# Patient Record
Sex: Female | Born: 1935 | Race: White | Hispanic: No | Marital: Married | Smoking: Never smoker
Health system: Southern US, Community
[De-identification: ages and names within clinical notes are randomized; demographics above are authoritative.]

## PROBLEM LIST (undated history)

## (undated) DIAGNOSIS — I10 Essential (primary) hypertension: Secondary | ICD-10-CM

---

## 1999-03-18 ENCOUNTER — Encounter: Payer: Self-pay | Admitting: Obstetrics and Gynecology

## 1999-03-18 ENCOUNTER — Encounter: Admission: RE | Admit: 1999-03-18 | Discharge: 1999-03-18 | Payer: Self-pay | Admitting: Obstetrics and Gynecology

## 2000-03-27 ENCOUNTER — Encounter: Payer: Self-pay | Admitting: Obstetrics and Gynecology

## 2000-03-27 ENCOUNTER — Encounter: Admission: RE | Admit: 2000-03-27 | Discharge: 2000-03-27 | Payer: Self-pay | Admitting: Obstetrics and Gynecology

## 2001-06-26 ENCOUNTER — Encounter: Admission: RE | Admit: 2001-06-26 | Discharge: 2001-06-26 | Payer: Self-pay | Admitting: Obstetrics and Gynecology

## 2001-06-26 ENCOUNTER — Encounter: Payer: Self-pay | Admitting: Obstetrics and Gynecology

## 2020-05-15 ENCOUNTER — Emergency Department (HOSPITAL_COMMUNITY)
Admission: EM | Admit: 2020-05-15 | Discharge: 2020-05-15 | Disposition: A | Discharge status: Home / Self Care | Payer: Medicare Other | Attending: Emergency Medicine | Admitting: Emergency Medicine

## 2020-05-15 ENCOUNTER — Emergency Department (HOSPITAL_COMMUNITY): Payer: Medicare Other

## 2020-05-15 ENCOUNTER — Other Ambulatory Visit: Payer: Self-pay

## 2020-05-15 ENCOUNTER — Encounter (HOSPITAL_COMMUNITY): Payer: Self-pay | Admitting: Emergency Medicine

## 2020-05-15 DIAGNOSIS — R112 Nausea with vomiting, unspecified: Secondary | ICD-10-CM | POA: Insufficient documentation

## 2020-05-15 DIAGNOSIS — Z7982 Long term (current) use of aspirin: Secondary | ICD-10-CM | POA: Insufficient documentation

## 2020-05-15 DIAGNOSIS — S0511XA Contusion of eyeball and orbital tissues, right eye, initial encounter: Secondary | ICD-10-CM | POA: Diagnosis not present

## 2020-05-15 DIAGNOSIS — W228XXA Striking against or struck by other objects, initial encounter: Secondary | ICD-10-CM | POA: Insufficient documentation

## 2020-05-15 DIAGNOSIS — R55 Syncope and collapse: Secondary | ICD-10-CM

## 2020-05-15 DIAGNOSIS — S0501XA Injury of conjunctiva and corneal abrasion without foreign body, right eye, initial encounter: Secondary | ICD-10-CM | POA: Diagnosis present

## 2020-05-15 LAB — CBC WITH DIFFERENTIAL/PLATELET
Abs Immature Granulocytes: 0.06 10*3/uL (ref 0.00–0.07)
Basophils Absolute: 0 10*3/uL (ref 0.0–0.1)
Basophils Relative: 0 %
Eosinophils Absolute: 0 10*3/uL (ref 0.0–0.5)
Eosinophils Relative: 0 %
HCT: 35.4 % — ABNORMAL LOW (ref 36.0–46.0)
Hemoglobin: 11.9 g/dL — ABNORMAL LOW (ref 12.0–15.0)
Immature Granulocytes: 1 %
Lymphocytes Relative: 7 %
Lymphs Abs: 0.6 10*3/uL — ABNORMAL LOW (ref 0.7–4.0)
MCH: 30.5 pg (ref 26.0–34.0)
MCHC: 33.6 g/dL (ref 30.0–36.0)
MCV: 90.8 fL (ref 80.0–100.0)
Monocytes Absolute: 0.2 10*3/uL (ref 0.1–1.0)
Monocytes Relative: 2 %
Neutro Abs: 7.7 10*3/uL (ref 1.7–7.7)
Neutrophils Relative %: 90 %
Platelets: 257 10*3/uL (ref 150–400)
RBC: 3.9 MIL/uL (ref 3.87–5.11)
RDW: 11.8 % (ref 11.5–15.5)
WBC: 8.6 10*3/uL (ref 4.0–10.5)
nRBC: 0 % (ref 0.0–0.2)

## 2020-05-15 LAB — TROPONIN I (HIGH SENSITIVITY): Troponin I (High Sensitivity): 6 ng/L (ref <18)

## 2020-05-15 LAB — COMPREHENSIVE METABOLIC PANEL
ALT: 18 U/L (ref 0–44)
AST: 22 U/L (ref 15–41)
Albumin: 3.7 g/dL (ref 3.5–5.0)
Alkaline Phosphatase: 66 U/L (ref 38–126)
Anion gap: 12 (ref 5–15)
BUN: 11 mg/dL (ref 8–23)
CO2: 23 mmol/L (ref 22–32)
Calcium: 9.2 mg/dL (ref 8.9–10.3)
Chloride: 91 mmol/L — ABNORMAL LOW (ref 98–111)
Creatinine, Ser: 0.61 mg/dL (ref 0.44–1.00)
GFR, Estimated: >60 mL/min (ref >60)
Glucose, Bld: 135 mg/dL — ABNORMAL HIGH (ref 70–99)
Potassium: 3.5 mmol/L (ref 3.5–5.1)
Sodium: 126 mmol/L — ABNORMAL LOW (ref 135–145)
Total Bilirubin: 0.6 mg/dL (ref 0.3–1.2)
Total Protein: 7.1 g/dL (ref 6.5–8.1)

## 2020-05-15 LAB — URINALYSIS, ROUTINE W REFLEX MICROSCOPIC
Bilirubin Urine: NEGATIVE
Glucose, UA: NEGATIVE mg/dL
Hgb urine dipstick: NEGATIVE
Ketones, ur: NEGATIVE mg/dL
Leukocytes,Ua: NEGATIVE
Nitrite: NEGATIVE
Protein, ur: NEGATIVE mg/dL
Specific Gravity, Urine: 1.018 (ref 1.005–1.030)
pH: 6 (ref 5.0–8.0)

## 2020-05-15 LAB — LIPASE, BLOOD: Lipase: 41 U/L (ref 11–51)

## 2020-05-15 MED ORDER — ONDANSETRON 4 MG PO TBDP
4.0000 mg | ORAL_TABLET | Freq: Once | ORAL | Status: AC
  Administered 2020-05-15: 4 mg via ORAL
  Filled 2020-05-15: qty 1

## 2020-05-15 MED ORDER — ONDANSETRON 4 MG PO TBDP: 4.0000 mg | ORAL_TABLET | Freq: Three times a day (TID) | ORAL | 0 refills | Status: AC | PRN

## 2020-05-15 NOTE — ED Notes (Signed)
Oneita Hurt, [place], made aware that pt would be returning with new prescriptions. Granddaughter will also be bringing pt back to the facility.

## 2020-05-15 NOTE — ED Provider Notes (Signed)
[facility] EMERGENCY DEPARTMENT Provider Note   CSN: [id number] Arrival date & time: 05/15/20  1713     History Chief Complaint  Patient presents with  . Loss of Consciousness    Brittney Baker is a 87 y.o. female with h/o HLD who presents to ED via EMS from SNF for N/V and syncope. Patient awoke in her usual state of health earlier today. Several hours after eating lunch, patient developed nausea and vomiting. NBNB emesis noted. No diarrhea or abdominal pain. Immediately after vomiting, patient briefly passed out and struck her head against the commode. Husband witnessed her fall. She states she awoke almost immediately after the fall. Denies prodromal headache, vision changes, palpitations, chest pain, or SOB. No vomiting since that time. In ED, denies HA, vision changes, abdominal pain, urinary symptoms, numbness, weakness, or trouble walking.  The history is provided by the patient and medical records.  Loss of Consciousness Episode history:  Single Most recent episode:  Today Duration: several seconds. Timing:  Rare Progression:  Resolved Chronicity:  New Context comment:  Vomiting Witnessed: yes   Relieved by:  Nothing Worsened by:  Nothing Ineffective treatments:  None tried Associated symptoms: nausea and vomiting   Associated symptoms: no chest pain, no diaphoresis, no difficulty breathing, no dizziness, no fever, no focal sensory loss, no focal weakness, no headaches, no palpitations, no recent injury, no seizures, no shortness of breath, no visual change and no weakness   Risk factors: no coronary artery disease and no seizures        History reviewed. No pertinent past medical history.  There are no problems to display for this patient.   History reviewed. No pertinent surgical history.   OB History   No obstetric history on file.     History reviewed. No pertinent family history.     Home Medications Prior to Admission medications    Medication Sig Start Date End Date Taking? Authorizing Provider  aspirin EC 81 MG tablet Take 81 mg by mouth every morning. Swallow whole.   Yes [provider]  Brinzolamide-Brimonidine (SIMBRINZA) 1-0.2 % SUSP Place 1 drop into both eyes at bedtime.   Yes [provider]  lisinopril-hydrochlorothiazide (ZESTORETIC) 20-12.5 MG tablet Take 1 tablet by mouth every morning.   Yes [provider]  ondansetron (ZOFRAN ODT) 4 MG disintegrating tablet Take 1 tablet (4 mg total) by mouth every 8 (eight) hours as needed for up to 3 days for nausea or vomiting. 05/15/20 05/18/20 Yes Tonia Brooms, MD  simvastatin (ZOCOR) 20 MG tablet Take 20 mg by mouth at bedtime.   Yes [provider]  Vitamin D, Ergocalciferol, (DRISDOL) 1.25 MG (50000 UNIT) CAPS capsule Take 50,000 Units by mouth every Thursday.   Yes [provider]    Allergies    Clindamycin/lincomycin  Review of Systems   Review of Systems  Constitutional: Negative for chills, diaphoresis and fever.  HENT: Negative for ear pain and sore throat.   Eyes: Negative for pain and visual disturbance.  Respiratory: Negative for cough and shortness of breath.   Cardiovascular: Positive for syncope. Negative for chest pain and palpitations.  Gastrointestinal: Positive for nausea and vomiting. Negative for abdominal pain, constipation and diarrhea.  Genitourinary: Negative for dysuria and hematuria.  Musculoskeletal: Negative for arthralgias and back pain.  Skin: Negative for color change and rash.  Neurological: Positive for syncope. Negative for dizziness, focal weakness, seizures, weakness and headaches.  All other systems reviewed and are negative.  Physical Exam Updated Vital Signs BP 121/70 (BP Location: Right Arm)   Pulse 85   Temp 98.7 F (37.1 C) (Oral)   Resp 20   Ht 4\' 11"  (1.499 m)   Wt 47.6 kg   SpO2 98%   BMI 21.21 kg/m   Physical Exam Vitals and nursing note reviewed.   Constitutional:      General: She is awake. She is not in acute distress.    Appearance: Normal appearance. She is well-developed, well-groomed and normal weight. She is not ill-appearing.  HENT:     Head: Normocephalic. No laceration.     Jaw: There is normal jaw occlusion. No trismus, tenderness, swelling or malocclusion.     Comments: Bruising under R eye without underlying deformity or tenderness    Right Ear: External ear normal.     Left Ear: External ear normal.     Nose: Nose normal.     Mouth/Throat:     Mouth: Mucous membranes are moist.     Pharynx: Oropharynx is clear. No oropharyngeal exudate or posterior oropharyngeal erythema.  Eyes:     General: No visual field deficit or scleral icterus.       Right eye: No discharge.        Left eye: No discharge.     Extraocular Movements: Extraocular movements intact.     Conjunctiva/sclera: Conjunctivae normal.     Pupils: Pupils are equal, round, and reactive to light.  Cardiovascular:     Rate and Rhythm: Normal rate and regular rhythm.     Pulses: Normal pulses.     Heart sounds: Normal heart sounds.  Pulmonary:     Effort: Pulmonary effort is normal. No respiratory distress.     Breath sounds: Normal breath sounds.  Abdominal:     General: Abdomen is flat. There is no distension.     Palpations: Abdomen is soft.     Tenderness: There is no abdominal tenderness. There is no guarding or rebound.  Musculoskeletal:        General: No signs of injury.     Cervical back: No signs of trauma. No pain with movement, spinous process tenderness or muscular tenderness.     Right lower leg: No edema.     Left lower leg: No edema.  Skin:    General: Skin is warm and dry.     Findings: No rash.  Neurological:     General: No focal deficit present.     Mental Status: She is alert and oriented to person, place, and time.     GCS: GCS eye subscore is 4. GCS verbal subscore is 5. GCS motor subscore is 6.     Cranial Nerves: No  dysarthria or facial asymmetry.     Sensory: Sensation is intact.     Motor: Motor function is intact. No weakness.  Psychiatric:        Mood and Affect: Mood normal.        Behavior: Behavior normal. Behavior is cooperative.     ED Results / Procedures / Treatments   Labs (all labs ordered are listed, but only abnormal results are displayed) Labs Reviewed  CBC WITH DIFFERENTIAL/PLATELET - Abnormal; Notable for the following components:      Result Value   Hemoglobin 11.9 (*)    HCT 35.4 (*)    Lymphs Abs 0.6 (*)    All other components within normal limits  COMPREHENSIVE METABOLIC PANEL - Abnormal; Notable for the following components:   Sodium  126 (*)    Chloride 91 (*)    Glucose, Bld 135 (*)    All other components within normal limits  LIPASE, BLOOD  URINALYSIS, ROUTINE W REFLEX MICROSCOPIC  TROPONIN I (HIGH SENSITIVITY)    EKG EKG Interpretation  Date/Time:  Saturday May 15 2020 17:18:56 EST Ventricular Rate:  96 PR Interval:    QRS Duration: 86 QT Interval:  379 QTC Calculation: 479 R Axis:   86 Text Interpretation: Sinus rhythm Borderline prolonged PR interval Biatrial enlargement Borderline right axis deviation No old tracing to compare Confirmed by Jacalyn LefevreHaviland, Julie [phone]([id number]) on 05/15/2020 5:22:11 PM   Radiology CT Head Wo Contrast  Result Date: 05/15/2020 CLINICAL DATA:  Head trauma.  Syncope. EXAM: CT HEAD WITHOUT CONTRAST TECHNIQUE: Contiguous axial images were obtained from the base of the skull through the vertex without intravenous contrast. COMPARISON:  None. FINDINGS: Brain: No evidence of acute infarction, hemorrhage, hydrocephalus, extra-axial collection or mass lesion/mass effect. Moderate brain parenchymal volume loss and deep white matter microangiopathy. Vascular: No hyperdense vessel or unexpected calcification. Skull: Normal. Negative for fracture or focal lesion. Sinuses/Orbits: No acute finding. Other: None. IMPRESSION: 1. No acute intracranial  abnormality. 2. Moderate brain parenchymal atrophy and chronic microvascular disease. Electronically Signed   By: Ted Mcalpineobrinka  Dimitrova M.D.   On: 05/15/2020 17:53   DG Chest Portable 1 View  Result Date: 05/15/2020 CLINICAL DATA:  Syncopal episodes. EXAM: PORTABLE CHEST 1 VIEW COMPARISON:  None. FINDINGS: Cardiomediastinal silhouette is normal. Mediastinal contours appear intact. Calcific atherosclerotic disease of the aorta. There is no evidence of focal airspace consolidation, pleural effusion or pneumothorax. Osseous structures are without acute abnormality. Soft tissues are grossly normal. IMPRESSION: No active disease. Electronically Signed   By: Ted Mcalpineobrinka  Dimitrova M.D.   On: 05/15/2020 17:47    Procedures Procedures  Medications Ordered in ED Medications  ondansetron (ZOFRAN-ODT) disintegrating tablet 4 mg (4 mg Oral Given 05/15/20 1824)    ED Course  I have reviewed the triage vital signs and the nursing notes.  Pertinent labs & imaging results that were available during my care of the patient were reviewed by me and considered in my medical decision making (see chart for details).    MDM Rules/Calculators/A&P                          Patient is a very well-appearing 86yoF with history and physical as described above who presents to the ED for N/V and syncope. VS reassuring and HDS. Patient resting comfortably and in no acute distress. No focal neuro deficits. Will obtain head CT, CXR, blood work, and ECG. Initial treatment includes Zofran.  ECG not concerning for ischemia or dysrhythmia. Head CT and CXR unremarkable. Labs notable for Na 126 (no old labs available for comparison) and Cl 91. Remainder of labs reassuring. On reassessment, patient remains well appearing and comfortable. No episodes of emesis during ED course and tolerating PO. Suspect syncopal event likely vasovagal in setting of emesis. Given overall reassuring workup, doubt CVA/TIA, ACS, PE, dysrhythmia, sepsis,  hypoglycemia, seizure, SBO, UTI, pyelonephritis, PTX, cardiac tamponade, or ICH. Sodium noted to be low, however given patient awake, alert, and otherwise feeling herself, doubt this would be contributing to her current clinical presentation. Recommend she follow up with PCP this upcoming weak to have sodium rechecked. She is otherwise HDS and appropriate for discharge.  Strict return precautions provided and discussed. Questions and concerns addressed. Will prescribe Zofran at home for nausea. Patient  verbalized understanding and amenable with discharge plan. Discharged in stable condition.  Final Clinical Impression(s) / ED Diagnoses Final diagnoses:  Vasovagal syncope  Non-intractable vomiting with nausea, unspecified vomiting type    Rx / DC Orders ED Discharge Orders         Ordered    ondansetron (ZOFRAN ODT) 4 MG disintegrating tablet  Every 8 hours PRN        05/15/20 2026           Tonia Brooms, MD 05/16/20 0001    Jacalyn Lefevre, MD 05/16/20 1553

## 2020-05-15 NOTE — ED Triage Notes (Signed)
Pt BIB [facility] from [facility] after suffering two syncopal episodes. One of which did result in a fall where the pt hit her head. Notable swelling and bruising to R eye noted. Pt has been having episodes of nausea/vomiting/diarrhea that started after lunch around 11 a.m. today. Pt VSS per EMS. EMS observed a pre-syncopal episode when obtaining orthostatic VS. Pt positive for orthostatics. Other VSS. Pt AOx4 and not reporting any pain at this time.

## 2020-05-15 NOTE — ED Notes (Signed)
Patient verbalizes understanding of discharge instructions. Prescriptions and follow-up care reviewed. Facility made aware that pt will be returning. Opportunity for questioning and answers were provided. Armband removed by staff, pt discharged from ED ambulatory.

## 2020-05-16 ENCOUNTER — Encounter (HOSPITAL_COMMUNITY): Payer: Self-pay | Admitting: Emergency Medicine

## 2020-09-15 ENCOUNTER — Ambulatory Visit: Payer: Self-pay | Admitting: Family Medicine

## 2020-11-23 ENCOUNTER — Ambulatory Visit: Payer: Medicare Other | Admitting: Emergency Medicine

## 2021-01-27 ENCOUNTER — Ambulatory Visit: Payer: Medicare Other | Admitting: Internal Medicine

## 2021-04-08 DIAGNOSIS — H401132 Primary open-angle glaucoma, bilateral, moderate stage: Secondary | ICD-10-CM | POA: Diagnosis not present

## 2021-04-08 DIAGNOSIS — H2513 Age-related nuclear cataract, bilateral: Secondary | ICD-10-CM | POA: Diagnosis not present

## 2021-08-05 DIAGNOSIS — H401132 Primary open-angle glaucoma, bilateral, moderate stage: Secondary | ICD-10-CM | POA: Diagnosis not present

## 2021-08-05 DIAGNOSIS — H2513 Age-related nuclear cataract, bilateral: Secondary | ICD-10-CM | POA: Diagnosis not present

## 2021-08-10 DIAGNOSIS — E871 Hypo-osmolality and hyponatremia: Secondary | ICD-10-CM | POA: Diagnosis not present

## 2021-08-10 DIAGNOSIS — I2510 Atherosclerotic heart disease of native coronary artery without angina pectoris: Secondary | ICD-10-CM | POA: Diagnosis not present

## 2021-08-10 DIAGNOSIS — E559 Vitamin D deficiency, unspecified: Secondary | ICD-10-CM | POA: Diagnosis not present

## 2021-08-10 DIAGNOSIS — I10 Essential (primary) hypertension: Secondary | ICD-10-CM | POA: Diagnosis not present

## 2021-08-10 DIAGNOSIS — E7800 Pure hypercholesterolemia, unspecified: Secondary | ICD-10-CM | POA: Diagnosis not present

## 2021-12-06 DIAGNOSIS — H2513 Age-related nuclear cataract, bilateral: Secondary | ICD-10-CM | POA: Diagnosis not present

## 2021-12-06 DIAGNOSIS — H401132 Primary open-angle glaucoma, bilateral, moderate stage: Secondary | ICD-10-CM | POA: Diagnosis not present

## 2021-12-23 DIAGNOSIS — M545 Low back pain, unspecified: Secondary | ICD-10-CM | POA: Diagnosis not present

## 2021-12-27 DIAGNOSIS — R918 Other nonspecific abnormal finding of lung field: Secondary | ICD-10-CM | POA: Diagnosis not present

## 2021-12-27 DIAGNOSIS — S22089A Unspecified fracture of T11-T12 vertebra, initial encounter for closed fracture: Secondary | ICD-10-CM | POA: Diagnosis not present

## 2021-12-27 DIAGNOSIS — J90 Pleural effusion, not elsewhere classified: Secondary | ICD-10-CM | POA: Diagnosis not present

## 2021-12-27 DIAGNOSIS — M549 Dorsalgia, unspecified: Secondary | ICD-10-CM | POA: Diagnosis not present

## 2021-12-27 DIAGNOSIS — R52 Pain, unspecified: Secondary | ICD-10-CM | POA: Diagnosis not present

## 2021-12-27 DIAGNOSIS — M4319 Spondylolisthesis, multiple sites in spine: Secondary | ICD-10-CM | POA: Diagnosis not present

## 2021-12-27 DIAGNOSIS — I10 Essential (primary) hypertension: Secondary | ICD-10-CM | POA: Diagnosis not present

## 2021-12-27 DIAGNOSIS — J80 Acute respiratory distress syndrome: Secondary | ICD-10-CM | POA: Diagnosis not present

## 2021-12-27 DIAGNOSIS — E871 Hypo-osmolality and hyponatremia: Secondary | ICD-10-CM | POA: Diagnosis not present

## 2021-12-27 DIAGNOSIS — Z743 Need for continuous supervision: Secondary | ICD-10-CM | POA: Diagnosis not present

## 2021-12-28 DIAGNOSIS — M545 Low back pain, unspecified: Secondary | ICD-10-CM | POA: Diagnosis not present

## 2021-12-28 DIAGNOSIS — S22080A Wedge compression fracture of T11-T12 vertebra, initial encounter for closed fracture: Secondary | ICD-10-CM | POA: Diagnosis not present

## 2021-12-28 DIAGNOSIS — Z1152 Encounter for screening for COVID-19: Secondary | ICD-10-CM | POA: Diagnosis not present

## 2021-12-28 DIAGNOSIS — R0602 Shortness of breath: Secondary | ICD-10-CM | POA: Diagnosis not present

## 2021-12-28 DIAGNOSIS — E785 Hyperlipidemia, unspecified: Secondary | ICD-10-CM | POA: Diagnosis not present

## 2021-12-28 DIAGNOSIS — Z79899 Other long term (current) drug therapy: Secondary | ICD-10-CM | POA: Diagnosis not present

## 2021-12-28 DIAGNOSIS — M4319 Spondylolisthesis, multiple sites in spine: Secondary | ICD-10-CM | POA: Diagnosis not present

## 2021-12-28 DIAGNOSIS — R0600 Dyspnea, unspecified: Secondary | ICD-10-CM | POA: Diagnosis not present

## 2021-12-28 DIAGNOSIS — E876 Hypokalemia: Secondary | ICD-10-CM | POA: Diagnosis not present

## 2021-12-28 DIAGNOSIS — R918 Other nonspecific abnormal finding of lung field: Secondary | ICD-10-CM | POA: Diagnosis not present

## 2021-12-28 DIAGNOSIS — W1830XA Fall on same level, unspecified, initial encounter: Secondary | ICD-10-CM | POA: Diagnosis not present

## 2021-12-28 DIAGNOSIS — K5900 Constipation, unspecified: Secondary | ICD-10-CM | POA: Diagnosis not present

## 2021-12-28 DIAGNOSIS — S3992XA Unspecified injury of lower back, initial encounter: Secondary | ICD-10-CM | POA: Diagnosis not present

## 2021-12-28 DIAGNOSIS — E222 Syndrome of inappropriate secretion of antidiuretic hormone: Secondary | ICD-10-CM | POA: Diagnosis not present

## 2021-12-28 DIAGNOSIS — S22089A Unspecified fracture of T11-T12 vertebra, initial encounter for closed fracture: Secondary | ICD-10-CM | POA: Diagnosis not present

## 2021-12-28 DIAGNOSIS — R110 Nausea: Secondary | ICD-10-CM | POA: Diagnosis not present

## 2021-12-28 DIAGNOSIS — W19XXXA Unspecified fall, initial encounter: Secondary | ICD-10-CM | POA: Diagnosis not present

## 2021-12-28 DIAGNOSIS — S22081A Stable burst fracture of T11-T12 vertebra, initial encounter for closed fracture: Secondary | ICD-10-CM | POA: Diagnosis not present

## 2021-12-28 DIAGNOSIS — R062 Wheezing: Secondary | ICD-10-CM | POA: Diagnosis not present

## 2021-12-28 DIAGNOSIS — Z7982 Long term (current) use of aspirin: Secondary | ICD-10-CM | POA: Diagnosis not present

## 2021-12-28 DIAGNOSIS — E871 Hypo-osmolality and hyponatremia: Secondary | ICD-10-CM | POA: Diagnosis not present

## 2021-12-28 DIAGNOSIS — R0902 Hypoxemia: Secondary | ICD-10-CM | POA: Diagnosis not present

## 2021-12-28 DIAGNOSIS — M4854XA Collapsed vertebra, not elsewhere classified, thoracic region, initial encounter for fracture: Secondary | ICD-10-CM | POA: Diagnosis not present

## 2021-12-28 DIAGNOSIS — S22082A Unstable burst fracture of T11-T12 vertebra, initial encounter for closed fracture: Secondary | ICD-10-CM | POA: Diagnosis not present

## 2021-12-28 DIAGNOSIS — J90 Pleural effusion, not elsewhere classified: Secondary | ICD-10-CM | POA: Diagnosis not present

## 2021-12-28 DIAGNOSIS — D72829 Elevated white blood cell count, unspecified: Secondary | ICD-10-CM | POA: Diagnosis not present

## 2021-12-28 DIAGNOSIS — I10 Essential (primary) hypertension: Secondary | ICD-10-CM | POA: Diagnosis not present

## 2021-12-29 DIAGNOSIS — R0602 Shortness of breath: Secondary | ICD-10-CM | POA: Diagnosis not present

## 2021-12-29 DIAGNOSIS — S3992XA Unspecified injury of lower back, initial encounter: Secondary | ICD-10-CM | POA: Diagnosis not present

## 2021-12-29 DIAGNOSIS — R110 Nausea: Secondary | ICD-10-CM | POA: Diagnosis not present

## 2021-12-29 DIAGNOSIS — R0600 Dyspnea, unspecified: Secondary | ICD-10-CM | POA: Diagnosis not present

## 2021-12-29 DIAGNOSIS — W19XXXA Unspecified fall, initial encounter: Secondary | ICD-10-CM | POA: Diagnosis not present

## 2021-12-29 DIAGNOSIS — K5900 Constipation, unspecified: Secondary | ICD-10-CM | POA: Diagnosis not present

## 2021-12-29 DIAGNOSIS — I10 Essential (primary) hypertension: Secondary | ICD-10-CM | POA: Diagnosis not present

## 2021-12-29 DIAGNOSIS — S22089A Unspecified fracture of T11-T12 vertebra, initial encounter for closed fracture: Secondary | ICD-10-CM | POA: Diagnosis not present

## 2021-12-29 DIAGNOSIS — E871 Hypo-osmolality and hyponatremia: Secondary | ICD-10-CM | POA: Diagnosis not present

## 2021-12-29 DIAGNOSIS — S22081A Stable burst fracture of T11-T12 vertebra, initial encounter for closed fracture: Secondary | ICD-10-CM | POA: Diagnosis not present

## 2021-12-30 DIAGNOSIS — R110 Nausea: Secondary | ICD-10-CM | POA: Diagnosis not present

## 2021-12-30 DIAGNOSIS — K5900 Constipation, unspecified: Secondary | ICD-10-CM | POA: Diagnosis not present

## 2021-12-30 DIAGNOSIS — I10 Essential (primary) hypertension: Secondary | ICD-10-CM | POA: Diagnosis not present

## 2021-12-30 DIAGNOSIS — R0600 Dyspnea, unspecified: Secondary | ICD-10-CM | POA: Diagnosis not present

## 2021-12-30 DIAGNOSIS — J90 Pleural effusion, not elsewhere classified: Secondary | ICD-10-CM | POA: Diagnosis not present

## 2021-12-30 DIAGNOSIS — E871 Hypo-osmolality and hyponatremia: Secondary | ICD-10-CM | POA: Diagnosis not present

## 2021-12-30 DIAGNOSIS — M4854XA Collapsed vertebra, not elsewhere classified, thoracic region, initial encounter for fracture: Secondary | ICD-10-CM | POA: Diagnosis not present

## 2021-12-30 DIAGNOSIS — S22082A Unstable burst fracture of T11-T12 vertebra, initial encounter for closed fracture: Secondary | ICD-10-CM | POA: Diagnosis not present

## 2021-12-30 DIAGNOSIS — W19XXXA Unspecified fall, initial encounter: Secondary | ICD-10-CM | POA: Diagnosis not present

## 2021-12-30 DIAGNOSIS — S22081A Stable burst fracture of T11-T12 vertebra, initial encounter for closed fracture: Secondary | ICD-10-CM | POA: Diagnosis not present

## 2021-12-31 DIAGNOSIS — K5900 Constipation, unspecified: Secondary | ICD-10-CM | POA: Diagnosis not present

## 2021-12-31 DIAGNOSIS — I10 Essential (primary) hypertension: Secondary | ICD-10-CM | POA: Diagnosis not present

## 2021-12-31 DIAGNOSIS — R110 Nausea: Secondary | ICD-10-CM | POA: Diagnosis not present

## 2021-12-31 DIAGNOSIS — W19XXXA Unspecified fall, initial encounter: Secondary | ICD-10-CM | POA: Diagnosis not present

## 2021-12-31 DIAGNOSIS — S22081A Stable burst fracture of T11-T12 vertebra, initial encounter for closed fracture: Secondary | ICD-10-CM | POA: Diagnosis not present

## 2021-12-31 DIAGNOSIS — E871 Hypo-osmolality and hyponatremia: Secondary | ICD-10-CM | POA: Diagnosis not present

## 2021-12-31 DIAGNOSIS — R0600 Dyspnea, unspecified: Secondary | ICD-10-CM | POA: Diagnosis not present

## 2022-01-01 DIAGNOSIS — I10 Essential (primary) hypertension: Secondary | ICD-10-CM | POA: Diagnosis not present

## 2022-01-01 DIAGNOSIS — K5900 Constipation, unspecified: Secondary | ICD-10-CM | POA: Diagnosis not present

## 2022-01-01 DIAGNOSIS — W19XXXA Unspecified fall, initial encounter: Secondary | ICD-10-CM | POA: Diagnosis not present

## 2022-01-01 DIAGNOSIS — E871 Hypo-osmolality and hyponatremia: Secondary | ICD-10-CM | POA: Diagnosis not present

## 2022-01-01 DIAGNOSIS — R110 Nausea: Secondary | ICD-10-CM | POA: Diagnosis not present

## 2022-01-01 DIAGNOSIS — R0600 Dyspnea, unspecified: Secondary | ICD-10-CM | POA: Diagnosis not present

## 2022-01-01 DIAGNOSIS — S22081A Stable burst fracture of T11-T12 vertebra, initial encounter for closed fracture: Secondary | ICD-10-CM | POA: Diagnosis not present

## 2022-01-02 DIAGNOSIS — J90 Pleural effusion, not elsewhere classified: Secondary | ICD-10-CM | POA: Diagnosis not present

## 2022-01-02 DIAGNOSIS — S22081A Stable burst fracture of T11-T12 vertebra, initial encounter for closed fracture: Secondary | ICD-10-CM | POA: Diagnosis not present

## 2022-01-02 DIAGNOSIS — E871 Hypo-osmolality and hyponatremia: Secondary | ICD-10-CM | POA: Diagnosis not present

## 2022-01-02 DIAGNOSIS — S22080A Wedge compression fracture of T11-T12 vertebra, initial encounter for closed fracture: Secondary | ICD-10-CM | POA: Diagnosis not present

## 2022-01-02 DIAGNOSIS — W19XXXA Unspecified fall, initial encounter: Secondary | ICD-10-CM | POA: Diagnosis not present

## 2022-01-02 DIAGNOSIS — K5900 Constipation, unspecified: Secondary | ICD-10-CM | POA: Diagnosis not present

## 2022-01-02 DIAGNOSIS — I10 Essential (primary) hypertension: Secondary | ICD-10-CM | POA: Diagnosis not present

## 2022-01-02 DIAGNOSIS — R0600 Dyspnea, unspecified: Secondary | ICD-10-CM | POA: Diagnosis not present

## 2022-01-02 DIAGNOSIS — R110 Nausea: Secondary | ICD-10-CM | POA: Diagnosis not present

## 2022-01-02 DIAGNOSIS — S22082A Unstable burst fracture of T11-T12 vertebra, initial encounter for closed fracture: Secondary | ICD-10-CM | POA: Diagnosis not present

## 2022-01-03 DIAGNOSIS — S22081A Stable burst fracture of T11-T12 vertebra, initial encounter for closed fracture: Secondary | ICD-10-CM | POA: Diagnosis not present

## 2022-01-03 DIAGNOSIS — I10 Essential (primary) hypertension: Secondary | ICD-10-CM | POA: Diagnosis not present

## 2022-01-03 DIAGNOSIS — E871 Hypo-osmolality and hyponatremia: Secondary | ICD-10-CM | POA: Diagnosis not present

## 2022-01-03 DIAGNOSIS — W19XXXA Unspecified fall, initial encounter: Secondary | ICD-10-CM | POA: Diagnosis not present

## 2022-01-03 DIAGNOSIS — S22080A Wedge compression fracture of T11-T12 vertebra, initial encounter for closed fracture: Secondary | ICD-10-CM | POA: Diagnosis not present

## 2022-01-03 DIAGNOSIS — K5900 Constipation, unspecified: Secondary | ICD-10-CM | POA: Diagnosis not present

## 2022-01-03 DIAGNOSIS — R0600 Dyspnea, unspecified: Secondary | ICD-10-CM | POA: Diagnosis not present

## 2022-01-03 DIAGNOSIS — R110 Nausea: Secondary | ICD-10-CM | POA: Diagnosis not present

## 2022-01-04 DIAGNOSIS — E871 Hypo-osmolality and hyponatremia: Secondary | ICD-10-CM | POA: Diagnosis not present

## 2022-01-04 DIAGNOSIS — K5900 Constipation, unspecified: Secondary | ICD-10-CM | POA: Diagnosis not present

## 2022-01-04 DIAGNOSIS — I10 Essential (primary) hypertension: Secondary | ICD-10-CM | POA: Diagnosis not present

## 2022-01-04 DIAGNOSIS — W19XXXA Unspecified fall, initial encounter: Secondary | ICD-10-CM | POA: Diagnosis not present

## 2022-01-04 DIAGNOSIS — R110 Nausea: Secondary | ICD-10-CM | POA: Diagnosis not present

## 2022-01-04 DIAGNOSIS — S22081A Stable burst fracture of T11-T12 vertebra, initial encounter for closed fracture: Secondary | ICD-10-CM | POA: Diagnosis not present

## 2022-01-04 DIAGNOSIS — R0600 Dyspnea, unspecified: Secondary | ICD-10-CM | POA: Diagnosis not present

## 2022-01-05 DIAGNOSIS — E871 Hypo-osmolality and hyponatremia: Secondary | ICD-10-CM | POA: Diagnosis not present

## 2022-01-05 DIAGNOSIS — K5900 Constipation, unspecified: Secondary | ICD-10-CM | POA: Diagnosis not present

## 2022-01-05 DIAGNOSIS — I10 Essential (primary) hypertension: Secondary | ICD-10-CM | POA: Diagnosis not present

## 2022-01-05 DIAGNOSIS — S22081A Stable burst fracture of T11-T12 vertebra, initial encounter for closed fracture: Secondary | ICD-10-CM | POA: Diagnosis not present

## 2022-01-06 DIAGNOSIS — S22081A Stable burst fracture of T11-T12 vertebra, initial encounter for closed fracture: Secondary | ICD-10-CM | POA: Diagnosis not present

## 2022-01-06 DIAGNOSIS — K5900 Constipation, unspecified: Secondary | ICD-10-CM | POA: Diagnosis not present

## 2022-01-06 DIAGNOSIS — E871 Hypo-osmolality and hyponatremia: Secondary | ICD-10-CM | POA: Diagnosis not present

## 2022-01-06 DIAGNOSIS — I10 Essential (primary) hypertension: Secondary | ICD-10-CM | POA: Diagnosis not present

## 2022-01-07 DIAGNOSIS — S22081A Stable burst fracture of T11-T12 vertebra, initial encounter for closed fracture: Secondary | ICD-10-CM | POA: Diagnosis not present

## 2022-01-07 DIAGNOSIS — K5900 Constipation, unspecified: Secondary | ICD-10-CM | POA: Diagnosis not present

## 2022-01-07 DIAGNOSIS — I10 Essential (primary) hypertension: Secondary | ICD-10-CM | POA: Diagnosis not present

## 2022-01-07 DIAGNOSIS — E871 Hypo-osmolality and hyponatremia: Secondary | ICD-10-CM | POA: Diagnosis not present

## 2022-01-09 DIAGNOSIS — E785 Hyperlipidemia, unspecified: Secondary | ICD-10-CM | POA: Diagnosis not present

## 2022-01-09 DIAGNOSIS — W19XXXD Unspecified fall, subsequent encounter: Secondary | ICD-10-CM | POA: Diagnosis not present

## 2022-01-09 DIAGNOSIS — I10 Essential (primary) hypertension: Secondary | ICD-10-CM | POA: Diagnosis not present

## 2022-01-09 DIAGNOSIS — R0902 Hypoxemia: Secondary | ICD-10-CM | POA: Diagnosis not present

## 2022-01-09 DIAGNOSIS — S22081A Stable burst fracture of T11-T12 vertebra, initial encounter for closed fracture: Secondary | ICD-10-CM | POA: Diagnosis not present

## 2022-01-09 DIAGNOSIS — E871 Hypo-osmolality and hyponatremia: Secondary | ICD-10-CM | POA: Diagnosis not present

## 2022-01-16 DIAGNOSIS — R296 Repeated falls: Secondary | ICD-10-CM | POA: Diagnosis not present

## 2022-01-16 DIAGNOSIS — I10 Essential (primary) hypertension: Secondary | ICD-10-CM | POA: Diagnosis not present

## 2022-01-16 DIAGNOSIS — Z7982 Long term (current) use of aspirin: Secondary | ICD-10-CM | POA: Diagnosis not present

## 2022-01-16 DIAGNOSIS — R0602 Shortness of breath: Secondary | ICD-10-CM | POA: Diagnosis not present

## 2022-01-16 DIAGNOSIS — K5900 Constipation, unspecified: Secondary | ICD-10-CM | POA: Diagnosis not present

## 2022-01-16 DIAGNOSIS — E871 Hypo-osmolality and hyponatremia: Secondary | ICD-10-CM | POA: Diagnosis not present

## 2022-01-16 DIAGNOSIS — S22080D Wedge compression fracture of T11-T12 vertebra, subsequent encounter for fracture with routine healing: Secondary | ICD-10-CM | POA: Diagnosis not present

## 2022-01-16 DIAGNOSIS — E785 Hyperlipidemia, unspecified: Secondary | ICD-10-CM | POA: Diagnosis not present

## 2022-01-18 DIAGNOSIS — I10 Essential (primary) hypertension: Secondary | ICD-10-CM | POA: Diagnosis not present

## 2022-01-18 DIAGNOSIS — S22080D Wedge compression fracture of T11-T12 vertebra, subsequent encounter for fracture with routine healing: Secondary | ICD-10-CM | POA: Diagnosis not present

## 2022-01-18 DIAGNOSIS — E871 Hypo-osmolality and hyponatremia: Secondary | ICD-10-CM | POA: Diagnosis not present

## 2022-01-18 DIAGNOSIS — Z9289 Personal history of other medical treatment: Secondary | ICD-10-CM | POA: Diagnosis not present

## 2022-01-19 DIAGNOSIS — K5900 Constipation, unspecified: Secondary | ICD-10-CM | POA: Diagnosis not present

## 2022-01-19 DIAGNOSIS — E871 Hypo-osmolality and hyponatremia: Secondary | ICD-10-CM | POA: Diagnosis not present

## 2022-01-19 DIAGNOSIS — E785 Hyperlipidemia, unspecified: Secondary | ICD-10-CM | POA: Diagnosis not present

## 2022-01-19 DIAGNOSIS — R0602 Shortness of breath: Secondary | ICD-10-CM | POA: Diagnosis not present

## 2022-01-19 DIAGNOSIS — R296 Repeated falls: Secondary | ICD-10-CM | POA: Diagnosis not present

## 2022-01-19 DIAGNOSIS — I10 Essential (primary) hypertension: Secondary | ICD-10-CM | POA: Diagnosis not present

## 2022-01-19 DIAGNOSIS — Z7982 Long term (current) use of aspirin: Secondary | ICD-10-CM | POA: Diagnosis not present

## 2022-01-19 DIAGNOSIS — S22080D Wedge compression fracture of T11-T12 vertebra, subsequent encounter for fracture with routine healing: Secondary | ICD-10-CM | POA: Diagnosis not present

## 2022-01-23 DIAGNOSIS — Z7982 Long term (current) use of aspirin: Secondary | ICD-10-CM | POA: Diagnosis not present

## 2022-01-23 DIAGNOSIS — R0602 Shortness of breath: Secondary | ICD-10-CM | POA: Diagnosis not present

## 2022-01-23 DIAGNOSIS — E871 Hypo-osmolality and hyponatremia: Secondary | ICD-10-CM | POA: Diagnosis not present

## 2022-01-23 DIAGNOSIS — I10 Essential (primary) hypertension: Secondary | ICD-10-CM | POA: Diagnosis not present

## 2022-01-23 DIAGNOSIS — E785 Hyperlipidemia, unspecified: Secondary | ICD-10-CM | POA: Diagnosis not present

## 2022-01-23 DIAGNOSIS — S22080D Wedge compression fracture of T11-T12 vertebra, subsequent encounter for fracture with routine healing: Secondary | ICD-10-CM | POA: Diagnosis not present

## 2022-01-23 DIAGNOSIS — R296 Repeated falls: Secondary | ICD-10-CM | POA: Diagnosis not present

## 2022-01-23 DIAGNOSIS — K5900 Constipation, unspecified: Secondary | ICD-10-CM | POA: Diagnosis not present

## 2022-01-25 DIAGNOSIS — S22009A Unspecified fracture of unspecified thoracic vertebra, initial encounter for closed fracture: Secondary | ICD-10-CM | POA: Diagnosis not present

## 2022-01-25 DIAGNOSIS — S22080D Wedge compression fracture of T11-T12 vertebra, subsequent encounter for fracture with routine healing: Secondary | ICD-10-CM | POA: Diagnosis not present

## 2022-01-25 DIAGNOSIS — Z7982 Long term (current) use of aspirin: Secondary | ICD-10-CM | POA: Diagnosis not present

## 2022-01-25 DIAGNOSIS — M5451 Vertebrogenic low back pain: Secondary | ICD-10-CM | POA: Diagnosis not present

## 2022-01-25 DIAGNOSIS — I10 Essential (primary) hypertension: Secondary | ICD-10-CM | POA: Diagnosis not present

## 2022-01-25 DIAGNOSIS — R296 Repeated falls: Secondary | ICD-10-CM | POA: Diagnosis not present

## 2022-01-25 DIAGNOSIS — E785 Hyperlipidemia, unspecified: Secondary | ICD-10-CM | POA: Diagnosis not present

## 2022-01-25 DIAGNOSIS — R0602 Shortness of breath: Secondary | ICD-10-CM | POA: Diagnosis not present

## 2022-01-25 DIAGNOSIS — E871 Hypo-osmolality and hyponatremia: Secondary | ICD-10-CM | POA: Diagnosis not present

## 2022-01-25 DIAGNOSIS — K5900 Constipation, unspecified: Secondary | ICD-10-CM | POA: Diagnosis not present

## 2022-01-26 DIAGNOSIS — S22080D Wedge compression fracture of T11-T12 vertebra, subsequent encounter for fracture with routine healing: Secondary | ICD-10-CM | POA: Diagnosis not present

## 2022-01-26 DIAGNOSIS — K5900 Constipation, unspecified: Secondary | ICD-10-CM | POA: Diagnosis not present

## 2022-01-26 DIAGNOSIS — E871 Hypo-osmolality and hyponatremia: Secondary | ICD-10-CM | POA: Diagnosis not present

## 2022-01-26 DIAGNOSIS — E785 Hyperlipidemia, unspecified: Secondary | ICD-10-CM | POA: Diagnosis not present

## 2022-01-26 DIAGNOSIS — R296 Repeated falls: Secondary | ICD-10-CM | POA: Diagnosis not present

## 2022-01-26 DIAGNOSIS — I10 Essential (primary) hypertension: Secondary | ICD-10-CM | POA: Diagnosis not present

## 2022-01-26 DIAGNOSIS — Z7982 Long term (current) use of aspirin: Secondary | ICD-10-CM | POA: Diagnosis not present

## 2022-01-26 DIAGNOSIS — R0602 Shortness of breath: Secondary | ICD-10-CM | POA: Diagnosis not present

## 2022-01-31 DIAGNOSIS — E785 Hyperlipidemia, unspecified: Secondary | ICD-10-CM | POA: Diagnosis not present

## 2022-01-31 DIAGNOSIS — Z7982 Long term (current) use of aspirin: Secondary | ICD-10-CM | POA: Diagnosis not present

## 2022-01-31 DIAGNOSIS — R296 Repeated falls: Secondary | ICD-10-CM | POA: Diagnosis not present

## 2022-01-31 DIAGNOSIS — K5900 Constipation, unspecified: Secondary | ICD-10-CM | POA: Diagnosis not present

## 2022-01-31 DIAGNOSIS — S22080D Wedge compression fracture of T11-T12 vertebra, subsequent encounter for fracture with routine healing: Secondary | ICD-10-CM | POA: Diagnosis not present

## 2022-01-31 DIAGNOSIS — I10 Essential (primary) hypertension: Secondary | ICD-10-CM | POA: Diagnosis not present

## 2022-01-31 DIAGNOSIS — R0602 Shortness of breath: Secondary | ICD-10-CM | POA: Diagnosis not present

## 2022-01-31 DIAGNOSIS — E871 Hypo-osmolality and hyponatremia: Secondary | ICD-10-CM | POA: Diagnosis not present

## 2022-02-02 DIAGNOSIS — I10 Essential (primary) hypertension: Secondary | ICD-10-CM | POA: Diagnosis not present

## 2022-02-02 DIAGNOSIS — R0602 Shortness of breath: Secondary | ICD-10-CM | POA: Diagnosis not present

## 2022-02-02 DIAGNOSIS — E785 Hyperlipidemia, unspecified: Secondary | ICD-10-CM | POA: Diagnosis not present

## 2022-02-02 DIAGNOSIS — K5900 Constipation, unspecified: Secondary | ICD-10-CM | POA: Diagnosis not present

## 2022-02-02 DIAGNOSIS — Z7982 Long term (current) use of aspirin: Secondary | ICD-10-CM | POA: Diagnosis not present

## 2022-02-02 DIAGNOSIS — R296 Repeated falls: Secondary | ICD-10-CM | POA: Diagnosis not present

## 2022-02-02 DIAGNOSIS — S22080D Wedge compression fracture of T11-T12 vertebra, subsequent encounter for fracture with routine healing: Secondary | ICD-10-CM | POA: Diagnosis not present

## 2022-02-02 DIAGNOSIS — E871 Hypo-osmolality and hyponatremia: Secondary | ICD-10-CM | POA: Diagnosis not present

## 2022-02-16 DIAGNOSIS — K5900 Constipation, unspecified: Secondary | ICD-10-CM | POA: Diagnosis not present

## 2022-02-16 DIAGNOSIS — R296 Repeated falls: Secondary | ICD-10-CM | POA: Diagnosis not present

## 2022-02-16 DIAGNOSIS — Z7982 Long term (current) use of aspirin: Secondary | ICD-10-CM | POA: Diagnosis not present

## 2022-02-16 DIAGNOSIS — R0602 Shortness of breath: Secondary | ICD-10-CM | POA: Diagnosis not present

## 2022-02-16 DIAGNOSIS — E871 Hypo-osmolality and hyponatremia: Secondary | ICD-10-CM | POA: Diagnosis not present

## 2022-02-16 DIAGNOSIS — S22080D Wedge compression fracture of T11-T12 vertebra, subsequent encounter for fracture with routine healing: Secondary | ICD-10-CM | POA: Diagnosis not present

## 2022-02-16 DIAGNOSIS — E785 Hyperlipidemia, unspecified: Secondary | ICD-10-CM | POA: Diagnosis not present

## 2022-02-16 DIAGNOSIS — I10 Essential (primary) hypertension: Secondary | ICD-10-CM | POA: Diagnosis not present

## 2022-02-22 DIAGNOSIS — M5451 Vertebrogenic low back pain: Secondary | ICD-10-CM | POA: Diagnosis not present

## 2022-04-04 DIAGNOSIS — H401132 Primary open-angle glaucoma, bilateral, moderate stage: Secondary | ICD-10-CM | POA: Diagnosis not present

## 2022-04-04 DIAGNOSIS — H2523 Age-related cataract, morgagnian type, bilateral: Secondary | ICD-10-CM | POA: Diagnosis not present

## 2022-06-26 DIAGNOSIS — I2510 Atherosclerotic heart disease of native coronary artery without angina pectoris: Secondary | ICD-10-CM | POA: Diagnosis not present

## 2022-06-26 DIAGNOSIS — M810 Age-related osteoporosis without current pathological fracture: Secondary | ICD-10-CM | POA: Diagnosis not present

## 2022-06-26 DIAGNOSIS — E559 Vitamin D deficiency, unspecified: Secondary | ICD-10-CM | POA: Diagnosis not present

## 2022-06-26 DIAGNOSIS — I10 Essential (primary) hypertension: Secondary | ICD-10-CM | POA: Diagnosis not present

## 2022-06-26 DIAGNOSIS — Z0000 Encounter for general adult medical examination without abnormal findings: Secondary | ICD-10-CM | POA: Diagnosis not present

## 2022-06-26 DIAGNOSIS — E7800 Pure hypercholesterolemia, unspecified: Secondary | ICD-10-CM | POA: Diagnosis not present

## 2022-06-26 DIAGNOSIS — H6123 Impacted cerumen, bilateral: Secondary | ICD-10-CM | POA: Diagnosis not present

## 2022-06-26 DIAGNOSIS — E871 Hypo-osmolality and hyponatremia: Secondary | ICD-10-CM | POA: Diagnosis not present

## 2022-06-26 DIAGNOSIS — Z9181 History of falling: Secondary | ICD-10-CM | POA: Diagnosis not present

## 2022-09-09 IMAGING — CT CT HEAD W/O CM
4 series · 17 of 47 positions shown, 19 images · non-contrast
Comparison: None.

CLINICAL DATA: Head trauma.  Syncope.

EXAM:
CT HEAD WITHOUT CONTRAST
TECHNIQUE: Contiguous axial images were obtained from the base of the skull
through the vertex without intravenous contrast.

[Series 3: head without · axial · non-contrast · 0.44mm/px · z∈[-31,+89]mm · 7 of 32 slices shown, 9 images]
[im 4/32  brain]
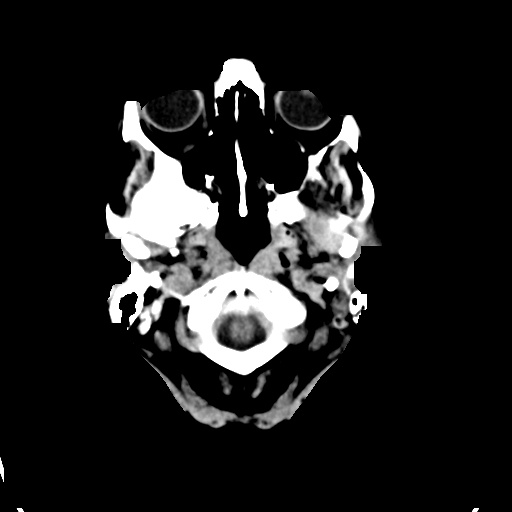
[im 4/32  bone]
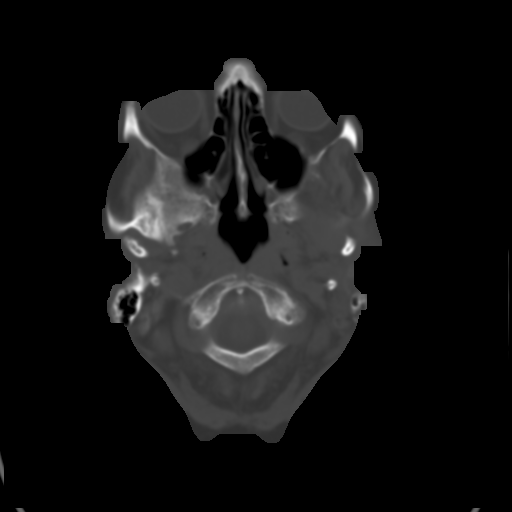
[im 8/32  brain]
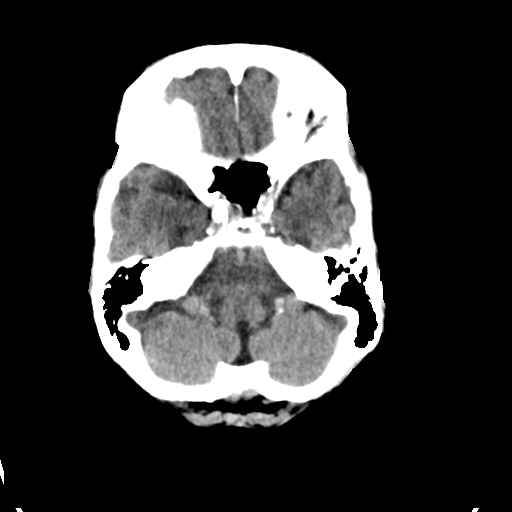
[im 12/32  brain]
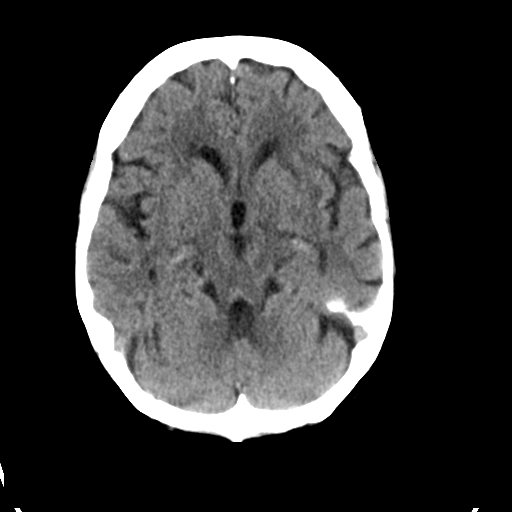
[im 16/32  brain]
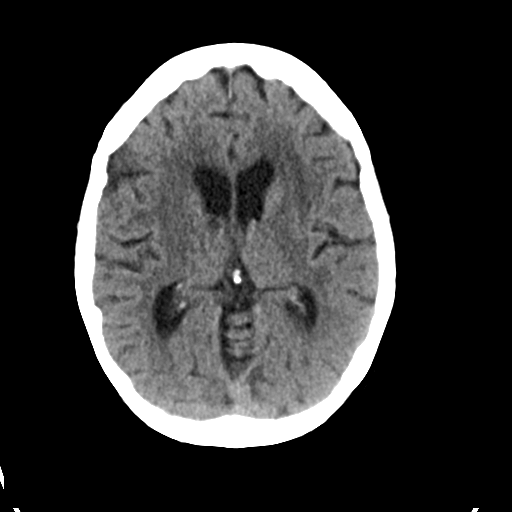
[im 20/32  brain]
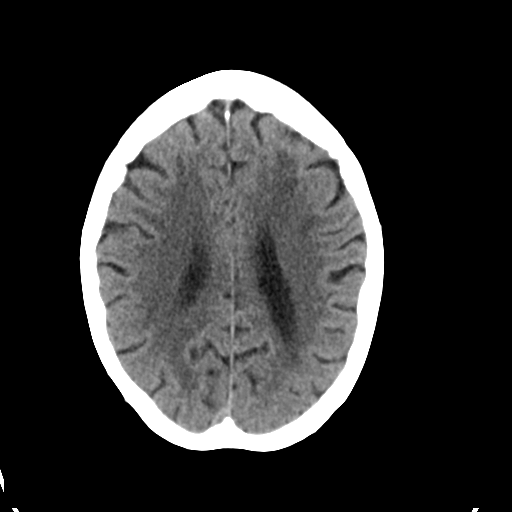
[im 20/32  bone]
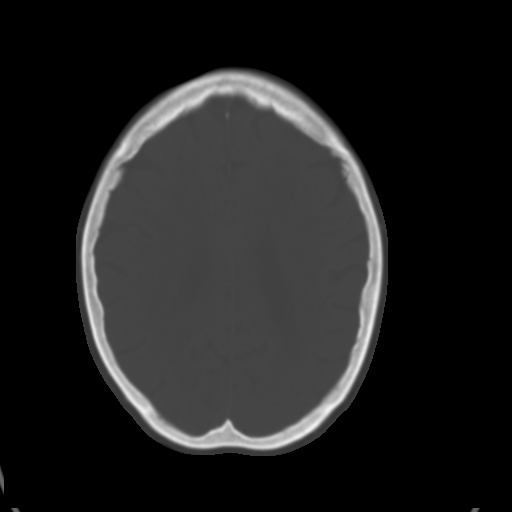
[im 24/32  brain]
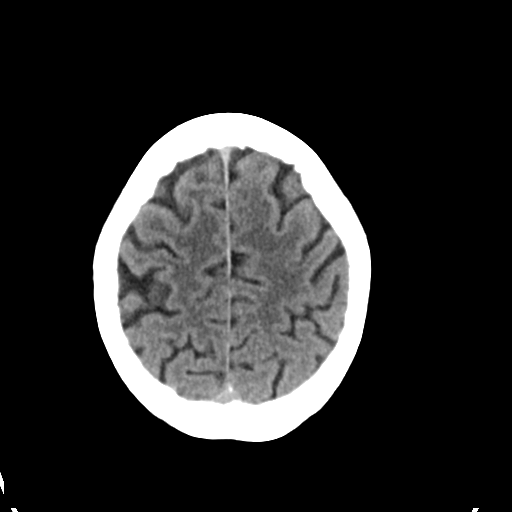
[im 28/32  brain]
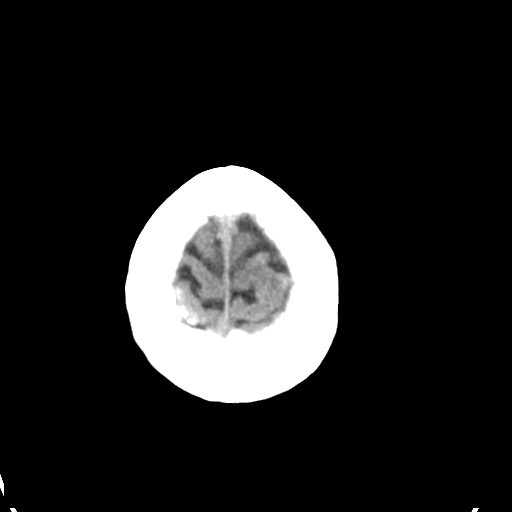

[Series 4: head bone · axial · 0.44mm/px · z∈[-32,+24]mm · 4 of 80 slices shown]
[im 8/80  bone]
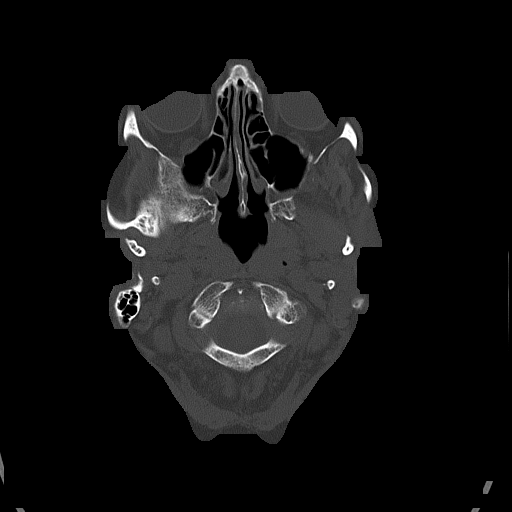
[im 16/80  bone]
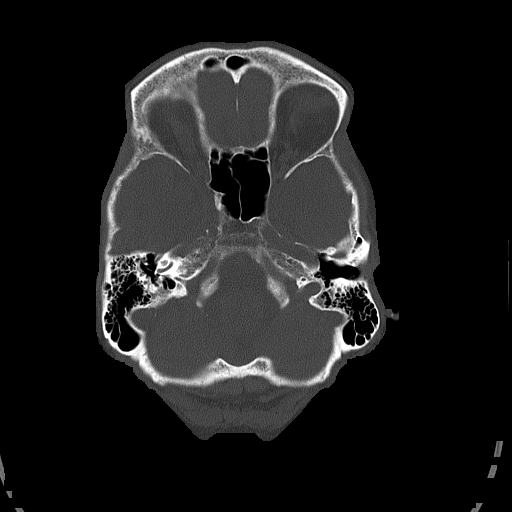
[im 24/80  bone]
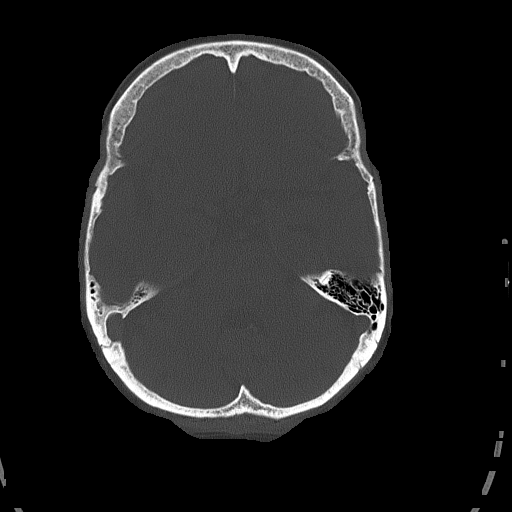
[im 36/80  bone]
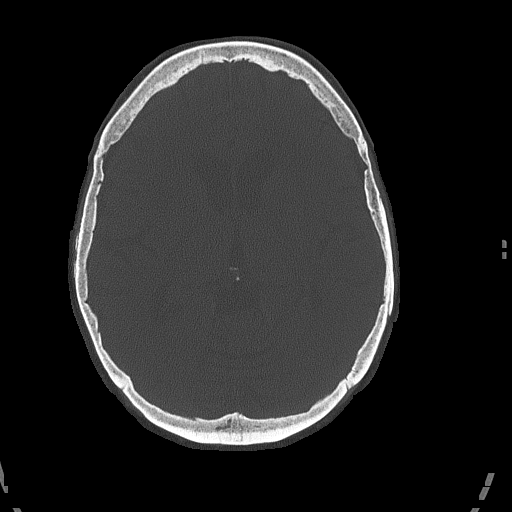

[Series 5: head without cor · coronal · non-contrast · 0.35mm/px · 3 of 71 slices shown]
[im 24/71  brain]
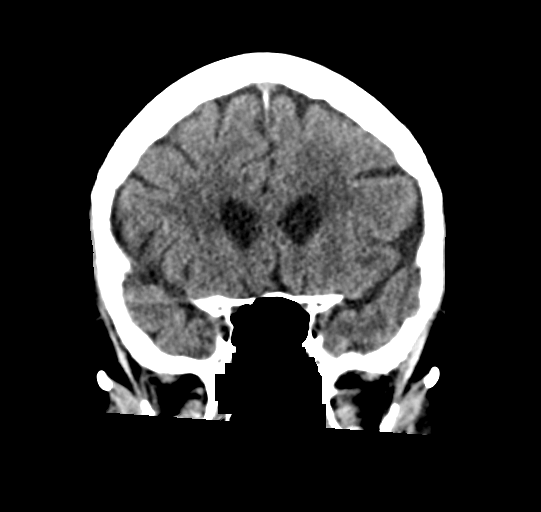
[im 32/71  brain]
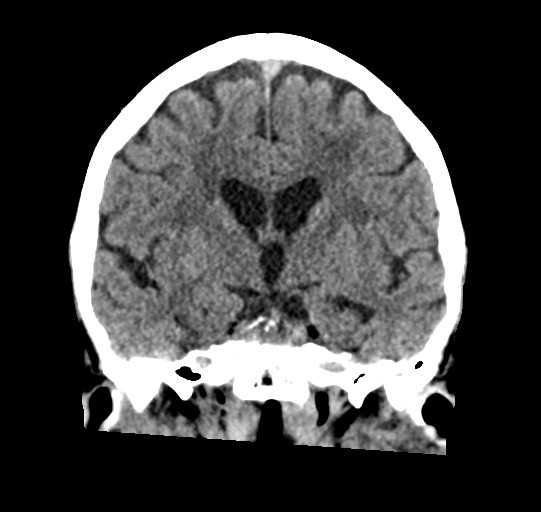
[im 39/71  brain]
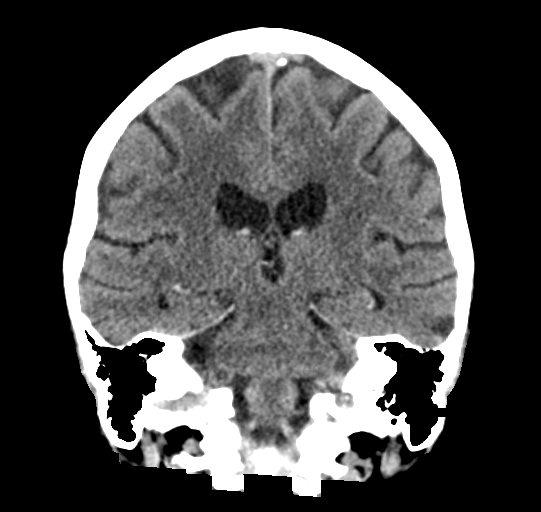

[Series 6: head without sag · sagittal · non-contrast · 0.37mm/px · 3 of 57 slices shown]
[im 19/57  brain]
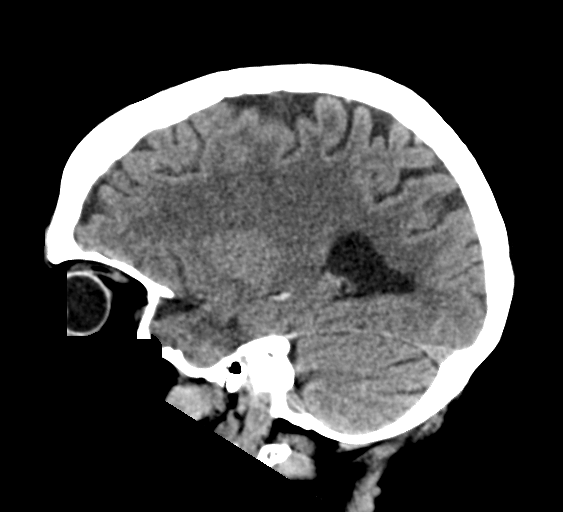
[im 29/57  brain]
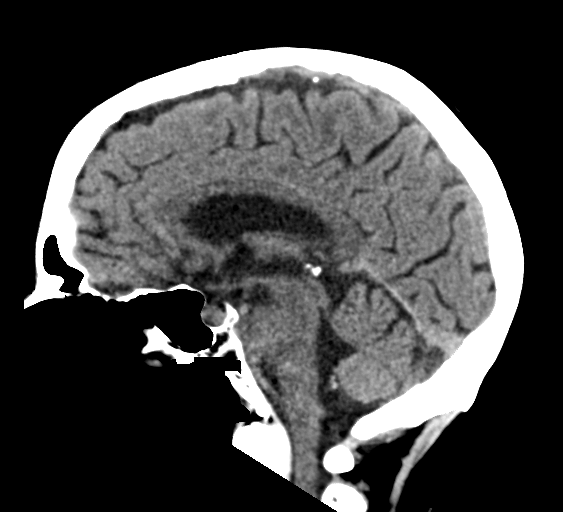
[im 38/57  brain]
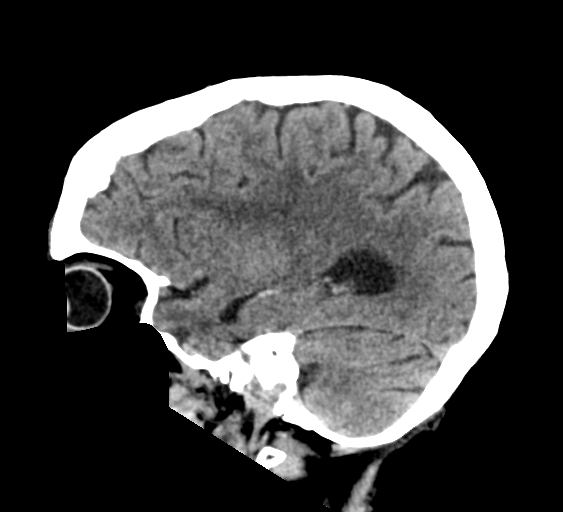

[17 of 47 positions shown; findings below may reference images not displayed]

FINDINGS: Brain: No evidence of acute infarction, hemorrhage, hydrocephalus,
extra-axial collection or mass lesion/mass effect. Moderate brain
parenchymal volume loss and deep white matter microangiopathy.

Vascular: No hyperdense vessel or unexpected calcification.

Skull: Normal. Negative for fracture or focal lesion.

Sinuses/Orbits: No acute finding.

Other: None.
IMPRESSION: 1. No acute intracranial abnormality.
2. Moderate brain parenchymal atrophy and chronic microvascular
disease.

## 2022-10-27 DIAGNOSIS — R29818 Other symptoms and signs involving the nervous system: Secondary | ICD-10-CM | POA: Diagnosis not present

## 2022-10-27 DIAGNOSIS — I10 Essential (primary) hypertension: Secondary | ICD-10-CM | POA: Diagnosis not present

## 2022-10-30 ENCOUNTER — Other Ambulatory Visit: Payer: Self-pay | Admitting: Physician Assistant

## 2022-10-30 ENCOUNTER — Ambulatory Visit
Admission: RE | Admit: 2022-10-30 | Discharge: 2022-10-30 | Disposition: A | Discharge status: Home / Self Care | Payer: Medicare HMO | Source: Ambulatory Visit | Attending: Physician Assistant | Admitting: Physician Assistant

## 2022-10-30 DIAGNOSIS — R4781 Slurred speech: Secondary | ICD-10-CM | POA: Diagnosis not present

## 2022-10-30 DIAGNOSIS — R29818 Other symptoms and signs involving the nervous system: Secondary | ICD-10-CM

## 2022-11-03 ENCOUNTER — Other Ambulatory Visit: Payer: Self-pay | Admitting: Physician Assistant

## 2022-11-03 DIAGNOSIS — Z8673 Personal history of transient ischemic attack (TIA), and cerebral infarction without residual deficits: Secondary | ICD-10-CM

## 2022-11-03 DIAGNOSIS — I2510 Atherosclerotic heart disease of native coronary artery without angina pectoris: Secondary | ICD-10-CM

## 2022-11-21 ENCOUNTER — Other Ambulatory Visit: Payer: Self-pay

## 2022-11-21 ENCOUNTER — Observation Stay (HOSPITAL_BASED_OUTPATIENT_CLINIC_OR_DEPARTMENT_OTHER)
Admission: EM | Admit: 2022-11-21 | Discharge: 2022-11-24 | Disposition: A | Discharge status: Home / Self Care | Payer: Medicare HMO | Attending: Internal Medicine | Admitting: Internal Medicine

## 2022-11-21 ENCOUNTER — Encounter (HOSPITAL_BASED_OUTPATIENT_CLINIC_OR_DEPARTMENT_OTHER): Payer: Self-pay

## 2022-11-21 DIAGNOSIS — E871 Hypo-osmolality and hyponatremia: Secondary | ICD-10-CM

## 2022-11-21 DIAGNOSIS — Z7982 Long term (current) use of aspirin: Secondary | ICD-10-CM | POA: Insufficient documentation

## 2022-11-21 DIAGNOSIS — K922 Gastrointestinal hemorrhage, unspecified: Secondary | ICD-10-CM | POA: Diagnosis not present

## 2022-11-21 DIAGNOSIS — M79606 Pain in leg, unspecified: Secondary | ICD-10-CM | POA: Insufficient documentation

## 2022-11-21 DIAGNOSIS — D649 Anemia, unspecified: Secondary | ICD-10-CM | POA: Diagnosis not present

## 2022-11-21 DIAGNOSIS — K625 Hemorrhage of anus and rectum: Principal | ICD-10-CM

## 2022-11-21 DIAGNOSIS — E559 Vitamin D deficiency, unspecified: Secondary | ICD-10-CM | POA: Diagnosis not present

## 2022-11-21 DIAGNOSIS — I10 Essential (primary) hypertension: Secondary | ICD-10-CM | POA: Diagnosis not present

## 2022-11-21 DIAGNOSIS — R739 Hyperglycemia, unspecified: Secondary | ICD-10-CM | POA: Insufficient documentation

## 2022-11-21 DIAGNOSIS — Z79899 Other long term (current) drug therapy: Secondary | ICD-10-CM | POA: Diagnosis not present

## 2022-11-21 DIAGNOSIS — E7800 Pure hypercholesterolemia, unspecified: Secondary | ICD-10-CM | POA: Diagnosis not present

## 2022-11-21 DIAGNOSIS — Z66 Do not resuscitate: Secondary | ICD-10-CM

## 2022-11-21 HISTORY — DX: Essential (primary) hypertension: I10

## 2022-11-21 LAB — CBC
HCT: 39.5 % (ref 36.0–46.0)
Hemoglobin: 13.5 g/dL (ref 12.0–15.0)
MCH: 31.2 pg (ref 26.0–34.0)
MCHC: 34.2 g/dL (ref 30.0–36.0)
MCV: 91.2 fL (ref 80.0–100.0)
Platelets: 251 10*3/uL (ref 150–400)
RBC: 4.33 MIL/uL (ref 3.87–5.11)
RDW: 12.4 % (ref 11.5–15.5)
WBC: 9.3 10*3/uL (ref 4.0–10.5)
nRBC: 0 % (ref 0.0–0.2)

## 2022-11-21 LAB — COMPREHENSIVE METABOLIC PANEL
ALT: 11 U/L (ref 0–44)
AST: 17 U/L (ref 15–41)
Albumin: 4.2 g/dL (ref 3.5–5.0)
Alkaline Phosphatase: 53 U/L (ref 38–126)
Anion gap: 9 (ref 5–15)
BUN: 14 mg/dL (ref 8–23)
CO2: 27 mmol/L (ref 22–32)
Calcium: 9.3 mg/dL (ref 8.9–10.3)
Chloride: 95 mmol/L — ABNORMAL LOW (ref 98–111)
Creatinine, Ser: 0.6 mg/dL (ref 0.44–1.00)
GFR, Estimated: >60 mL/min (ref >60)
Glucose, Bld: 157 mg/dL — ABNORMAL HIGH (ref 70–99)
Potassium: 3.9 mmol/L (ref 3.5–5.1)
Sodium: 131 mmol/L — ABNORMAL LOW (ref 135–145)
Total Bilirubin: 0.5 mg/dL (ref 0.3–1.2)
Total Protein: 7.5 g/dL (ref 6.5–8.1)

## 2022-11-21 LAB — HEMOGLOBIN AND HEMATOCRIT, BLOOD
HCT: 38.4 % (ref 36.0–46.0)
Hemoglobin: 13.3 g/dL (ref 12.0–15.0)

## 2022-11-21 LAB — OCCULT BLOOD X 1 CARD TO LAB, STOOL: Fecal Occult Bld: POSITIVE — AB

## 2022-11-21 LAB — ABO/RH: ABO/RH(D): A NEG

## 2022-11-21 MED ORDER — MELATONIN 5 MG PO TABS
10.0000 mg | ORAL_TABLET | Freq: Every evening | ORAL | Status: DC | PRN
  Filled 2022-11-21: qty 2

## 2022-11-21 MED ORDER — ONDANSETRON HCL 4 MG PO TABS: 4.0000 mg | ORAL_TABLET | Freq: Four times a day (QID) | ORAL | Status: DC | PRN

## 2022-11-21 MED ORDER — SIMVASTATIN 20 MG PO TABS
20.0000 mg | ORAL_TABLET | Freq: Every day | ORAL | Status: DC
  Administered 2022-11-22 – 2022-11-23 (×3): 20 mg via ORAL
  Filled 2022-11-21 (×3): qty 1

## 2022-11-21 MED ORDER — ACETAMINOPHEN 650 MG RE SUPP: 650.0000 mg | Freq: Four times a day (QID) | RECTAL | Status: DC | PRN

## 2022-11-21 MED ORDER — ACETAMINOPHEN 325 MG PO TABS: 650.0000 mg | ORAL_TABLET | Freq: Four times a day (QID) | ORAL | Status: DC | PRN

## 2022-11-21 MED ORDER — LABETALOL HCL 5 MG/ML IV SOLN: 10.0000 mg | INTRAVENOUS | Status: DC | PRN

## 2022-11-21 MED ORDER — ONDANSETRON HCL 4 MG/2ML IJ SOLN: 4.0000 mg | Freq: Four times a day (QID) | INTRAMUSCULAR | Status: DC | PRN

## 2022-11-21 NOTE — ED Notes (Signed)
Thomas with cl called for transport

## 2022-11-21 NOTE — H&P (Addendum)
History and Physical    Brittney Baker OZH:[id number] DOB: Aug 11, 1933 DOA: 11/21/2022  DOS: the patient was seen and examined on 11/21/2022  PCP: Patient, No Pcp Per   Patient coming from: Home  I have personally briefly reviewed patient's old medical records in [facility]  CC: rectal bleeding HPI: 89 year old female history of primary hypertension on HCTZ and lisinopril, vitamin D deficiency who presents to the ER today with 2 episodes of rectal bleeding.  She states that she got up this morning.  She was drinking tea and watching TV.  She has had a sudden urge to have a bowel movement.  She went to the  bathroom and had bright red bloody stool.  She cleaned herself up went back to sit down and then had another episode of rectal urgency.  Went to the bathroom again and had another bout of bright red blood per rectum.  There is no pain in her abdomen.  She has never had bleeding like this before.  She has never had hemorrhoids before.  She only takes a baby aspirin.  She moved in with her son [name] about 3 years ago from [facility].  Her husband is currently in memory care at [facility].  She stopped driving about 2-3 years ago.  She is still able to complete her ADLs.  She does use a walker occasionally.  Patient denies any chest pain, shortness of breath, nausea, vomiting or abdominal pain.  Patient brought to the ER.  On arrival temp 98.8 heart rate 108 blood pressure 194/98 satting 98% on room air.  Labs white count 9.3, hemoglobin 13.5, platelets of 251  Sodium 131, potassium 3.9, chloride 95, bicarb 27, BUN of 14, creatinine 0.6, glucose 127  Calcium 9.3, total protein 7.5, albumin 3.2, AST of 17, ALT of 11, alk phos 53, total 0.5  Repeat hemoglobin approximately 5 hours later was 13.3. EDP documents and examination showed gross blood in the rectum.  There is no external hemorrhoids.  Patient is not having more rectal bleeding since this morning.  EDP is  consulted Dr. Loreta Ave with GI  [facility] hospitalist consulted.      ED Course: gross blood on rectal exam per EDP. HgB 13.5  Review of Systems:  Review of Systems  Constitutional: Negative.   HENT: Negative.    Eyes: Negative.   Respiratory: Negative.    Cardiovascular: Negative.   Gastrointestinal:  Positive for blood in stool. Negative for abdominal pain, diarrhea, nausea and vomiting.  Genitourinary: Negative.   Musculoskeletal: Negative.   Skin: Negative.   Neurological: Negative.   Endo/Heme/Allergies: Negative.   Psychiatric/Behavioral: Negative.    All other systems reviewed and are negative.   Past Medical History:  Diagnosis Date   Benign essential HTN     History reviewed. No pertinent surgical history.   reports that she has never smoked. She has never used smokeless tobacco. She reports current alcohol use. She reports that she does not use drugs.  Allergies  Allergen Reactions   Clindamycin/Lincomycin Other (See Comments)    Unknown reaction - listed on [facility] 05/15/2020    History reviewed. No pertinent family history.  Prior to Admission medications   Medication Sig Start Date End Date Taking? Authorizing Provider  aspirin EC 81 MG tablet Take 81 mg by mouth every morning. Swallow whole.    [provider]  Brinzolamide-Brimonidine (SIMBRINZA) 1-0.2 % SUSP Place 1 drop into both eyes at bedtime.    [provider]  lisinopril-hydrochlorothiazide (ZESTORETIC) 20-12.5 MG tablet Take 1 tablet by mouth every morning.    [provider]  simvastatin (ZOCOR) 20 MG tablet Take 20 mg by mouth at bedtime.    [provider]  Vitamin D, Ergocalciferol, (DRISDOL) 1.25 MG (50000 UNIT) CAPS capsule Take 50,000 Units by mouth every Thursday.    [provider]    Physical Exam: Vitals:   11/21/22 1219 11/21/22 1612 11/21/22 1655 11/21/22 2000  BP: (!) 194/98 (!) 166/76  (!) 142/76  Pulse: (!) 108 100  89  Resp: 16 18  18    Temp: 98.8 F (37.1 C)  98.6 F (37 C) 98.4 F (36.9 C)  TempSrc:   Oral Oral  SpO2: 98% 93%  96%  Weight:      Height:        Physical Exam Vitals and nursing note reviewed.  Constitutional:      General: She is not in acute distress.    Appearance: Normal appearance. She is normal weight. She is not ill-appearing, toxic-appearing or diaphoretic.  HENT:     Head: Normocephalic and atraumatic.     Nose: Nose normal.  Eyes:     General: No scleral icterus. Cardiovascular:     Rate and Rhythm: Normal rate and regular rhythm.     Pulses: Normal pulses.  Pulmonary:     Effort: Pulmonary effort is normal. No respiratory distress.     Breath sounds: Normal breath sounds.  Abdominal:     General: Bowel sounds are normal. There is no distension.     Palpations: Abdomen is soft.     Tenderness: There is no abdominal tenderness.  Musculoskeletal:     Right lower leg: No edema.     Left lower leg: No edema.  Skin:    General: Skin is warm and dry.     Capillary Refill: Capillary refill takes less than 2 seconds.  Neurological:     General: No focal deficit present.     Mental Status: She is alert and oriented to person, place, and time.      Labs on Admission: I have personally reviewed following labs and imaging studies  CBC: Recent Labs  Lab 11/21/22 1229 11/21/22 1736  WBC 9.3  --   HGB 13.5 13.3  HCT 39.5 38.4  MCV 91.2  --   PLT 251  --    Basic Metabolic Panel: Recent Labs  Lab 11/21/22 1229  NA 131*  K 3.9  CL 95*  CO2 27  GLUCOSE 157*  BUN 14  CREATININE 0.60  CALCIUM 9.3   GFR: Estimated Creatinine Clearance: 30.8 mL/min (by C-G formula based on SCr of 0.6 mg/dL). Liver Function Tests: Recent Labs  Lab 11/21/22 1229  AST 17  ALT 11  ALKPHOS 53  BILITOT 0.5  PROT 7.5  ALBUMIN 4.2   Urine analysis:    Component Value Date/Time   COLORURINE YELLOW 05/15/2020 1953   APPEARANCEUR CLEAR 05/15/2020 1953   LABSPEC 1.018 05/15/2020 1953    PHURINE 6.0 05/15/2020 1953   GLUCOSEU NEGATIVE 05/15/2020 1953   HGBUR NEGATIVE 05/15/2020 1953   BILIRUBINUR NEGATIVE 05/15/2020 1953   KETONESUR NEGATIVE 05/15/2020 1953   PROTEINUR NEGATIVE 05/15/2020 1953   NITRITE NEGATIVE 05/15/2020 1953   LEUKOCYTESUR NEGATIVE 05/15/2020 1953    Radiological Exams on Admission: I have personally reviewed images No results found.  EKG: My personal interpretation of EKG shows: no EKG to review  Assessment/Plan Principal Problem:   Acute lower GI bleeding  Active Problems:   Hyponatremia   Primary hypertension   Vitamin D deficiency   DNR (do not resuscitate)/DNI(Do Not Intubate)   Pure hypercholesterolemia    Assessment and Plan: * Acute lower GI bleeding Observation med/surg bed. Check serial H&H q6h. On clear liquids. GI consulted([place]). GI bleeding likely diverticular. If she develops massive GI bleeding again, obtain STAT CT angio GI bleeding protocol. Hold on heparin/lovenox. SCDs for DVT prophylaxis. No prior hx of PUD. No epigastric pain. Hold off on PPI.  Hyponatremia Pt on hydrochlorothiazide for htn. Hold hydrochlorothiazide. Allow for clear liquids.  DNR (do not resuscitate)/DNI(Do Not Intubate) Verified DNR/DNI status with patient. She is competent to make her own medical decisions.  Vitamin D deficiency Stable. Pt take vitamin D 50,000 units qweekly.  Primary hypertension Stable. Hold hydrochlorothiazide/ACEI for now due to blood loss. Monitor BP.  Pure hypercholesterolemia Continue home simvastatin 20 mg qhs   DVT prophylaxis: SCDs Code Status: DNR/DNI(Do NOT Intubate). Verified with patient at bedside Family Communication: no family members at bedside  Disposition Plan: return home  Consults called: EDP has consulted Dr. Loreta Ave  Admission status: Observation, Med-Surg   Carollee Herter, DO [facility] 11/21/2022, 8:43 PM

## 2022-11-21 NOTE — Subjective & Objective (Signed)
CC: rectal bleeding HPI: 89 year old female history of primary hypertension on HCTZ and lisinopril, vitamin D deficiency who presents to the ER today with 2 episodes of rectal bleeding.  She states that she got up this morning.  She was drinking tea and watching TV.  She has had a sudden urge to have a bowel movement.  She went to the  bathroom and had bright red bloody stool.  She cleaned herself up went back to sit down and then had another episode of rectal urgency.  Went to the bathroom again and had another bout of bright red blood per rectum.  There is no pain in her abdomen.  She has never had bleeding like this before.  She has never had hemorrhoids before.  She only takes a baby aspirin.  She moved in with her son [name] about 3 years ago from [facility].  Her husband is currently in memory care at [facility].  She stopped driving about 2-3 years ago.  She is still able to complete her ADLs.  She does use a walker occasionally.  Patient denies any chest pain, shortness of breath, nausea, vomiting or abdominal pain.  Patient brought to the ER.  On arrival temp 98.8 heart rate 108 blood pressure 194/98 satting 98% on room air.  Labs white count 9.3, hemoglobin 13.5, platelets of 251  Sodium 131, potassium 3.9, chloride 95, bicarb 27, BUN of 14, creatinine 0.6, glucose 127  Calcium 9.3, total protein 7.5, albumin 3.2, AST of 17, ALT of 11, alk phos 53, total 0.5  Repeat hemoglobin approximately 5 hours later was 13.3. EDP documents and examination showed gross blood in the rectum.  There is no external hemorrhoids.  Patient is not having more rectal bleeding since this morning.  EDP is consulted Dr. Loreta Ave with GI  [facility] hospitalist consulted.

## 2022-11-21 NOTE — ED Notes (Signed)
Second attempt to call report to IP RN; still no answer on the floor. [facility] en route w/ pt

## 2022-11-21 NOTE — Assessment & Plan Note (Signed)
Observation med/surg bed. Check serial H&H q6h. On clear liquids. GI consulted([place]). GI bleeding likely diverticular. If she develops massive GI bleeding again, obtain STAT CT angio GI bleeding protocol. Hold on heparin/lovenox. SCDs for DVT prophylaxis. No prior hx of PUD. No epigastric pain. Hold off on PPI.

## 2022-11-21 NOTE — Assessment & Plan Note (Signed)
Continue home simvastatin 20 mg qhs

## 2022-11-21 NOTE — ED Notes (Signed)
Attempted to call report to IP RN; no answer

## 2022-11-21 NOTE — Assessment & Plan Note (Signed)
Verified DNR/DNI status with patient. She is competent to make her own medical decisions.

## 2022-11-21 NOTE — Assessment & Plan Note (Signed)
Stable. Hold hydrochlorothiazide/ACEI for now due to blood loss. Monitor BP.

## 2022-11-21 NOTE — Progress Notes (Signed)
Plan of Care Note for accepted transfer   Patient: Brittney Baker MRN: [id number]   DOA: 11/21/2022  Facility requesting transfer: [facility] Requesting Provider: Theresia Lo Reason for transfer: lower GI bleeding  Facility course: Patient with h/o HLD on 81 mg ASA presenting with rectal bleeding.  Small amount the last 2 days, large bloody stool today.  Currently asymptomatic.  No further bleeding in the ER.  Hgb 13.5, repeat pending.  Discussed with Dr. Elnoria Howard - monitor for now, follow serial Hgbs.  No GI doctor in [place].  Can admit to either [facility] or [facility], med surg.  Plan of care: The patient is accepted for admission to Med-surg  unit, at [facility].   Author: Jonah Blue, MD 11/21/2022  Check [email or web address] for on-call coverage.  Nursing staff, Please call [facility] Admits & Consults System-Wide number on Amion as soon as patient's arrival, so appropriate admitting provider can evaluate the pt.

## 2022-11-21 NOTE — Assessment & Plan Note (Signed)
Pt on hydrochlorothiazide for htn. Hold hydrochlorothiazide. Allow for clear liquids.

## 2022-11-21 NOTE — ED Provider Notes (Signed)
[place] EMERGENCY DEPARTMENT AT [facility] Provider Note   CSN: [id number] Arrival date & time: 11/21/22  1210     History  Chief Complaint  Patient presents with   Rectal Bleeding    Brittney Baker is a 89 y.o. female.  Patient is an 89 year old female with a past medical history of hyperlipidemia on daily baby aspirin presenting to the emergency department with rectal bleeding.  The patient states over the last few days she has had a little bit of blood when she wipes after having a bowel movement.  She states however this morning she had 2 episodes of bright red blood per rectum that filled the toilet.  She states that she was initially mildly lightheaded but that has now resolved.  She denies any associated abdominal pain or rectal pain.  She denies any recent constipation or straining.  She states that she does take occasional NSAIDs for her arthritis but nothing on a daily basis.  She states that her last colonoscopy was several years ago and believes that it was normal.  The history is provided by the patient and a relative.  Rectal Bleeding      Home Medications Prior to Admission medications   Medication Sig Start Date End Date Taking? Authorizing Provider  aspirin EC 81 MG tablet Take 81 mg by mouth every morning. Swallow whole.    [provider]  Brinzolamide-Brimonidine (SIMBRINZA) 1-0.2 % SUSP Place 1 drop into both eyes at bedtime.    [provider]  lisinopril-hydrochlorothiazide (ZESTORETIC) 20-12.5 MG tablet Take 1 tablet by mouth every morning.    [provider]  simvastatin (ZOCOR) 20 MG tablet Take 20 mg by mouth at bedtime.    [provider]  Vitamin D, Ergocalciferol, (DRISDOL) 1.25 MG (50000 UNIT) CAPS capsule Take 50,000 Units by mouth every Thursday.    [provider]      Allergies    Clindamycin/lincomycin    Review of Systems   Review of Systems  Gastrointestinal:  Positive for hematochezia.     Physical Exam Updated Vital Signs BP (!) 166/76   Pulse 100   Temp 98.6 F (37 C) (Oral)   Resp 18   Ht 4\' 9"  (1.448 m)   Wt 46.3 kg   SpO2 93%   BMI 22.07 kg/m  Physical Exam Vitals and nursing note reviewed.  Constitutional:      General: She is not in acute distress.    Appearance: Normal appearance.  HENT:     Head: Normocephalic and atraumatic.     Nose: Nose normal.     Mouth/Throat:     Mouth: Mucous membranes are moist.     Pharynx: Oropharynx is clear.  Eyes:     Extraocular Movements: Extraocular movements intact.     Conjunctiva/sclera: Conjunctivae normal.  Cardiovascular:     Rate and Rhythm: Normal rate and regular rhythm.     Heart sounds: Normal heart sounds.  Pulmonary:     Effort: Pulmonary effort is normal.     Breath sounds: Normal breath sounds.  Abdominal:     General: Abdomen is flat.     Palpations: Abdomen is soft.     Tenderness: There is no abdominal tenderness.  Genitourinary:    Rectum: Guaiac result positive (Gross blood in the rectum).     Comments: Small nonbleeding posterior fissure, no hemorrhoids Musculoskeletal:        General: Normal range of motion.     Cervical back: Normal range  of motion.  Skin:    General: Skin is warm and dry.  Neurological:     General: No focal deficit present.     Mental Status: She is alert and oriented to person, place, and time.  Psychiatric:        Mood and Affect: Mood normal.        Behavior: Behavior normal.     ED Results / Procedures / Treatments   Labs (all labs ordered are listed, but only abnormal results are displayed) Labs Reviewed  COMPREHENSIVE METABOLIC PANEL - Abnormal; Notable for the following components:      Result Value   Sodium 131 (*)    Chloride 95 (*)    Glucose, Bld 157 (*)    All other components within normal limits  CBC  OCCULT BLOOD X 1 CARD TO LAB, STOOL  HEMOGLOBIN AND HEMATOCRIT, BLOOD  ABO/RH    EKG None  Radiology No results  found.  Procedures Procedures    Medications Ordered in ED Medications - No data to display  ED Course/ Medical Decision Making/ A&P Clinical Course as of 11/21/22 1744  Tue Nov 21, 2022  1715 I spoke with Dr. Loreta Ave with GI who did agree with admission for serial H&H. He is covering [facility] and would be able to evaluate her if she is admitted there. [VK]  1743 Patient accepted by Dr Ophelia Charter for admission. [VK]    Clinical Course User Index [VK] Rexford Maus, DO                                 Medical Decision Making This patient presents to the ED with chief complaint(s) of rectal bleeding with pertinent past medical history of HLD, on daily aspirin which further complicates the presenting complaint. The complaint involves an extensive differential diagnosis and also carries with it a high risk of complications and morbidity.    The differential diagnosis includes GI bleed, anemia, coagulopathy, hemorrhoid, fissure  Additional history obtained: Additional history obtained from family Records reviewed Primary Care Documents  ED Course and Reassessment: Patient's arrival she was hemodynamically stable in no acute distress.  She has initially evaluated in the waiting room by triage and had labs performed that showed an initial hemoglobin of 13.5, mild dehydration with sodium of 131.  She has not had any further bloody bowel movements while in the emergency department.  She does have gross blood per rectum and will have a repeat H&H as it has been over 4 hours since her initial hemoglobin.  GI will be consulted and she will be closely reassessed.  Independent labs interpretation:  The following labs were independently interpreted: mild dehydration, otherwise within normal range  Independent visualization of imaging: - N/A  Consultation: - Consulted or discussed management/test interpretation w/ external professional: GI, hospitalist  Consideration for admission or further workup:  patient requires admission for her GI bleed Social Determinants of health: N/A    Amount and/or Complexity of Data Reviewed Labs: ordered.  Risk Decision regarding hospitalization.          Final Clinical Impression(s) / ED Diagnoses Final diagnoses:  Rectal bleeding    Rx / DC Orders ED Discharge Orders     None         Rexford Maus, DO 11/21/22 1744

## 2022-11-21 NOTE — ED Triage Notes (Signed)
Pt arrives with son whom she lives with states that she started having large amounts of rectal bleeding starting this morning. Denies being on a blood thinner.

## 2022-11-21 NOTE — Assessment & Plan Note (Signed)
Stable. Pt take vitamin D 50,000 units qweekly.

## 2022-11-22 DIAGNOSIS — I10 Essential (primary) hypertension: Secondary | ICD-10-CM | POA: Diagnosis not present

## 2022-11-22 DIAGNOSIS — K625 Hemorrhage of anus and rectum: Secondary | ICD-10-CM | POA: Diagnosis not present

## 2022-11-22 DIAGNOSIS — D509 Iron deficiency anemia, unspecified: Secondary | ICD-10-CM | POA: Diagnosis not present

## 2022-11-22 DIAGNOSIS — K922 Gastrointestinal hemorrhage, unspecified: Secondary | ICD-10-CM

## 2022-11-22 LAB — COMPREHENSIVE METABOLIC PANEL WITH GFR
ALT: 14 U/L (ref 0–44)
AST: 18 U/L (ref 15–41)
Albumin: 3.6 g/dL (ref 3.5–5.0)
Alkaline Phosphatase: 51 U/L (ref 38–126)
Anion gap: 9 (ref 5–15)
BUN: 9 mg/dL (ref 8–23)
CO2: 27 mmol/L (ref 22–32)
Calcium: 9.2 mg/dL (ref 8.9–10.3)
Chloride: 100 mmol/L (ref 98–111)
Creatinine, Ser: 0.72 mg/dL (ref 0.44–1.00)
GFR, Estimated: >60 mL/min
Glucose, Bld: 97 mg/dL (ref 70–99)
Potassium: 3.5 mmol/L (ref 3.5–5.1)
Sodium: 136 mmol/L (ref 135–145)
Total Bilirubin: 0.7 mg/dL (ref 0.3–1.2)
Total Protein: 6.9 g/dL (ref 6.5–8.1)

## 2022-11-22 LAB — HEMOGLOBIN AND HEMATOCRIT, BLOOD
HCT: 34.6 % — ABNORMAL LOW (ref 36.0–46.0)
HCT: 34.4 % — ABNORMAL LOW (ref 36.0–46.0)
HCT: 35.1 % — ABNORMAL LOW (ref 36.0–46.0)
Hemoglobin: 11.7 g/dL — ABNORMAL LOW (ref 12.0–15.0)
Hemoglobin: 11.4 g/dL — ABNORMAL LOW (ref 12.0–15.0)
Hemoglobin: 11.8 g/dL — ABNORMAL LOW (ref 12.0–15.0)

## 2022-11-22 LAB — MAGNESIUM: Magnesium: 2.1 mg/dL (ref 1.7–2.4)

## 2022-11-22 MED ORDER — INFLUENZA VAC A&B SURF ANT ADJ 0.5 ML IM SUSY
0.5000 mL | PREFILLED_SYRINGE | INTRAMUSCULAR | Status: DC
  Filled 2022-11-22: qty 0.5

## 2022-11-22 MED ORDER — LATANOPROST 0.005 % OP SOLN
1.0000 [drp] | Freq: Every day | OPHTHALMIC | Status: AC
  Administered 2022-11-22: 1 [drp] via OPHTHALMIC
  Filled 2022-11-22: qty 2.5

## 2022-11-22 NOTE — Hospital Course (Signed)
Brief hospital course: PMH of HTN, HLD, chronic leg pain presented to hospital with complaints of bright light blood per rectum without any pain. Multiple episodes reported at home.  Last BM on 11/15 on 9/4 did not have any blood. Assessment and Plan: Lower GI bleed. Suspected diverticular bleed. No imaging so far. If she actually has further episode of bleeding would recommend a CT angiogram. Discussed with the patient. Appreciate GI follow-up for now. Hemoglobin remaining stable for now.  HTN. Blood pressure soft. Will monitor.  Hyponatremia. Clinically not significant.  Monitor.  HLD. On statin.  Continue.  Leg pain. Patient reports she takes ibuprofen for intermittent leg pain. Advised him no significant etiology identified. Monitor. Recommend to take Tylenol instead of ibuprofen.

## 2022-11-22 NOTE — Plan of Care (Signed)

## 2022-11-22 NOTE — Care Management Obs Status (Signed)
MEDICARE OBSERVATION STATUS NOTIFICATION   Patient Details  Name: Brittney Baker MRN: [id number] Date of Birth: [birth year]   Medicare Observation Status Notification Given:  Yes    Harriett Sine, RN 11/22/2022, 2:54 PM

## 2022-11-22 NOTE — Progress Notes (Signed)
[facility] Progress Note Patient: Brittney Baker WUJ:[id number] DOB: [birth year] DOA: 11/21/2022  DOS: the patient was seen and examined on 11/22/2022  Brief hospital course: PMH of HTN, HLD, chronic leg pain presented to hospital with complaints of bright light blood per rectum without any pain. Multiple episodes reported at home.  Last BM on 11/15 on 9/4 did not have any blood. Assessment and Plan: Lower GI bleed. Suspected diverticular bleed. No imaging so far. If she actually has further episode of bleeding would recommend a CT angiogram. Discussed with the patient. Appreciate GI follow-up for now. Hemoglobin remaining stable for now.  HTN. Blood pressure soft. Will monitor.  Hyponatremia. Clinically not significant.  Monitor.  HLD. On statin.  Continue.  Leg pain. Patient reports she takes ibuprofen for intermittent leg pain. Advised him no significant etiology identified. Monitor. Recommend to take Tylenol instead of ibuprofen.   Subjective: No abdominal pain.  No nausea no vomiting.  Last BM did not have any blood.  Physical Exam: General: in Mild distress, No Rash Cardiovascular: S1 and S2 Present, No Murmur Respiratory: Good respiratory effort, Bilateral Air entry present. No Crackles, No wheezes Abdomen: Bowel Sound present, No tenderness Extremities: No edema Neuro: Alert and oriented x3, no new focal deficit  Data Reviewed: I have Reviewed nursing notes, Vitals, and Lab results. Since last encounter, pertinent lab results CBC and BMP   . I have ordered test including CBC and BMP  .   Disposition: Status is: Observation  SCDs Start: 11/21/22 2032   Family Communication: Son at bedside Level of care: Med-Surg   Vitals:   11/22/22 0356 11/22/22 0358 11/22/22 0914 11/22/22 1518  BP: 120/71  (!) 167/83 130/78  Pulse: 81  84 82  Resp: 18  16 16   Temp: 98.3 F (36.8 C)  98.4 F (36.9 C) 98.6 F (37 C)  TempSrc: Oral     SpO2: 97%  99% 97%  Weight:   48.5 kg    Height:         Author: Lynden Oxford, MD 11/22/2022 5:17 PM  Please look on [email or web address] to find out who is on call.

## 2022-11-22 NOTE — Plan of Care (Signed)
  Problem: Education: Goal: Knowledge of General Education information will improve Description: Including pain rating scale, medication(s)/side effects and non-pharmacologic comfort measures 11/22/2022 2214 by Jearl Klinefelter, RN Outcome: Progressing 11/22/2022 2214 by Jearl Klinefelter, RN Outcome: Progressing   Problem: Health Behavior/Discharge Planning: Goal: Ability to manage health-related needs will improve 11/22/2022 2214 by Jearl Klinefelter, RN Outcome: Progressing 11/22/2022 2214 by Jearl Klinefelter, RN Outcome: Progressing   Problem: Clinical Measurements: Goal: Ability to maintain clinical measurements within normal limits will improve 11/22/2022 2214 by Jearl Klinefelter, RN Outcome: Progressing 11/22/2022 2214 by Jearl Klinefelter, RN Outcome: Progressing Goal: Will remain free from infection 11/22/2022 2214 by Jearl Klinefelter, RN Outcome: Progressing 11/22/2022 2214 by Jearl Klinefelter, RN Outcome: Progressing Goal: Diagnostic test results will improve 11/22/2022 2214 by Jearl Klinefelter, RN Outcome: Progressing 11/22/2022 2214 by Jearl Klinefelter, RN Outcome: Progressing Goal: Respiratory complications will improve 11/22/2022 2214 by Jearl Klinefelter, RN Outcome: Progressing 11/22/2022 2214 by Jearl Klinefelter, RN Outcome: Progressing Goal: Cardiovascular complication will be avoided 11/22/2022 2214 by Jearl Klinefelter, RN Outcome: Progressing 11/22/2022 2214 by Jearl Klinefelter, RN Outcome: Progressing

## 2022-11-22 NOTE — Consult Note (Addendum)
UNASSIGNED PATIENT Reason for Consult: Rectal bleeding Referring Physician: [facility].  Brittney Baker is an 89 y.o. female.  HPI: Brittney Baker is a 89 year old white female with a history of HTN, Hyperlipidemia and vitamin D deficiency who presented to the hospital with 2 episodes of rectal bleeding. She said she noticed some bright red blood in the stool on couple of occasions but denies having any dizziness near syncope or syncope. She never had such problems in the past.  She claims she has had a colonoscopy in the remote past and with study was essentially normal. She has never been told she has diverticulosis. She denies having any abdominal pain nausea vomiting with her symptoms.  Her last BM had some old heme but she does not feel she has any active bleeding at the present time.  The drop in her hemoglobin was noted from 13.5 g/dL to 96.0 g/dL today.  Past Medical History:  Diagnosis Date   Benign essential HTN    History reviewed. No pertinent surgical history.  History reviewed. No pertinent family history.  Social History:  reports that she has never smoked. She has never used smokeless tobacco. She reports current alcohol use. She reports that she does not use drugs.  Allergies:  Allergies  Allergen Reactions   Clindamycin/Lincomycin Other (See Comments)    Unknown reaction - listed on [facility] 05/15/2020   Medications: I have reviewed the patient's current medications. Prior to Admission:  Medications Prior to Admission  Medication Sig Dispense Refill Last Dose   aspirin EC 81 MG tablet Take 81 mg by mouth every morning. Swallow whole.   11/21/2022   ibuprofen (ADVIL) 200 MG tablet Take 400 mg by mouth as needed for moderate pain or mild pain.   Past Week   latanoprost (XALATAN) 0.005 % ophthalmic solution Place 1 drop into both eyes at bedtime.   11/20/2022   simvastatin (ZOCOR) 20 MG tablet Take 20 mg by mouth at bedtime.   11/20/2022   Vitamin D, Ergocalciferol, (DRISDOL) 1.25 MG  (50000 UNIT) CAPS capsule Take 50,000 Units by mouth every Thursday.   Past Week   Scheduled:  [START ON 11/23/2022] influenza vaccine adjuvanted  0.5 mL Intramuscular Tomorrow-1000   simvastatin  20 mg Oral QHS   Continuous: AVW:[id number] **OR** acetaminophen, labetalol, melatonin, ondansetron **OR** ondansetron (ZOFRAN) IV  Results for orders placed or performed during the hospital encounter of 11/21/22 (from the past 48 hour(s))  Comprehensive metabolic panel     Status: Abnormal   Collection Time: 11/21/22 12:29 PM  Result Value Ref Range   Sodium 131 (L) 135 - 145 mmol/L   Potassium 3.9 3.5 - 5.1 mmol/L   Chloride 95 (L) 98 - 111 mmol/L   CO2 27 22 - 32 mmol/L   Glucose, Bld 157 (H) 70 - 99 mg/dL    Comment: Glucose reference range applies only to samples taken after fasting for at least 8 hours.   BUN 14 8 - 23 mg/dL   Creatinine, Ser 6.21 0.44 - 1.00 mg/dL   Calcium 9.3 8.9 - 30.8 mg/dL   Total Protein 7.5 6.5 - 8.1 g/dL   Albumin 4.2 3.5 - 5.0 g/dL   AST 17 15 - 41 U/L   ALT 11 0 - 44 U/L   Alkaline Phosphatase 53 38 - 126 U/L   Total Bilirubin 0.5 0.3 - 1.2 mg/dL   GFR, Estimated >65 >78 mL/min    Comment: (NOTE) Calculated using the CKD-EPI Creatinine Equation (2021)  Anion gap 9 5 - 15    Comment: Performed at [facility], [address] [place], [place] [address]  CBC     Status: None   Collection Time: 11/21/22 12:29 PM  Result Value Ref Range   WBC 9.3 4.0 - 10.5 K/uL   RBC 4.33 3.87 - 5.11 MIL/uL   Hemoglobin 13.5 12.0 - 15.0 g/dL   HCT 47.8 29.5 - 62.1 %   MCV 91.2 80.0 - 100.0 fL   MCH 31.2 26.0 - 34.0 pg   MCHC 34.2 30.0 - 36.0 g/dL   RDW 30.8 65.7 - 84.6 %   Platelets 251 150 - 400 K/uL   nRBC 0.0 0.0 - 0.2 %    Comment: Performed at [facility], [address], [place], [place] [address]  ABO/Rh     Status: None   Collection Time: 11/21/22  5:36 PM  Result Value Ref Range   ABO/RH(D)      A  NEG Performed at [facility], [address] [place], [place] [address]   Hemoglobin and hematocrit, blood     Status: None   Collection Time: 11/21/22  5:36 PM  Result Value Ref Range   Hemoglobin 13.3 12.0 - 15.0 g/dL   HCT 24.4 01.0 - 27.2 %    Comment: Performed at [facility], [address], [place], [place] [address]  Occult blood card to lab, stool     Status: Abnormal   Collection Time: 11/21/22  6:24 PM  Result Value Ref Range   Fecal Occult Bld POSITIVE (A) NEGATIVE    Comment: Performed at [facility], [address] [place], [place] [address]  Hemoglobin and hematocrit, blood     Status: Abnormal   Collection Time: 11/22/22 12:56 AM  Result Value Ref Range   Hemoglobin 11.7 (L) 12.0 - 15.0 g/dL   HCT 42.5 (L) 95.6 - 38.7 %    Comment: Performed at [facility], [address] [place], [place] [address]  Comprehensive metabolic panel     Status: None   Collection Time: 11/22/22 12:56 AM  Result Value Ref Range   Sodium 136 135 - 145 mmol/L   Potassium 3.5 3.5 - 5.1 mmol/L   Chloride 100 98 - 111 mmol/L   CO2 27 22 - 32 mmol/L   Glucose, Bld 97 70 - 99 mg/dL    Comment: Glucose reference range applies only to samples taken after fasting for at least 8 hours.   BUN 9 8 - 23 mg/dL   Creatinine, Ser 2.95 0.44 - 1.00 mg/dL   Calcium 9.2 8.9 - 18.8 mg/dL   Total Protein 6.9 6.5 - 8.1 g/dL   Albumin 3.6 3.5 - 5.0 g/dL   AST 18 15 - 41 U/L   ALT 14 0 - 44 U/L   Alkaline Phosphatase 51 38 - 126 U/L   Total Bilirubin 0.7 0.3 - 1.2 mg/dL   GFR, Estimated >41 >66 mL/min    Comment: (NOTE) Calculated using the CKD-EPI Creatinine Equation (2021)    Anion gap 9 5 - 15    Comment: Performed at [facility], [address], [place], [place] [address]  Magnesium     Status: None   Collection Time: 11/22/22 12:56 AM  Result Value Ref Range   Magnesium 2.1 1.7 - 2.4 mg/dL     Comment: Performed at [facility], [address], [place], [place] [address]  Hemoglobin and  hematocrit, blood     Status: Abnormal   Collection Time: 11/22/22  6:17 AM  Result Value Ref Range   Hemoglobin 11.4 (L) 12.0 - 15.0 g/dL   HCT 09.8 (L) 11.9 - 14.7 %    Comment: Performed at [facility], [address], [place], [place] [address]   Review of Systems  Constitutional: Negative.   HENT: Negative.    Eyes: Negative.   Respiratory: Negative.    Cardiovascular: Negative.   Gastrointestinal:  Positive for anal bleeding and blood in stool. Negative for abdominal distention, abdominal pain, constipation, diarrhea, nausea, rectal pain and vomiting.  Endocrine: Negative.   Genitourinary: Negative.   Musculoskeletal:  Positive for arthralgias and joint swelling.  Allergic/Immunologic: Negative.   Neurological: Negative.   Psychiatric/Behavioral: Negative.     Blood pressure 120/71, pulse 81, temperature 98.3 F (36.8 C), temperature source Oral, resp. rate 18, height 4\' 9"  (1.448 m), weight 48.5 kg, SpO2 97%. Physical Exam Constitutional:      General: She is not in acute distress.    Appearance: She is not ill-appearing or toxic-appearing.  HENT:     Head: Normocephalic and atraumatic.     Mouth/Throat:     Mouth: Mucous membranes are moist.  Eyes:     Extraocular Movements: Extraocular movements intact.     Pupils: Pupils are equal, round, and reactive to light.  Cardiovascular:     Rate and Rhythm: Normal rate and regular rhythm.  Pulmonary:     Effort: Pulmonary effort is normal.     Breath sounds: Normal breath sounds.  Abdominal:     General: Bowel sounds are normal.  Musculoskeletal:     Cervical back: Normal range of motion and neck supple.  Skin:    General: Skin is warm and dry.  Neurological:     Mental Status: She is alert and oriented to person, place, and time.  Psychiatric:        Mood and Affect: Mood normal.         Behavior: Behavior normal.        Thought Content: Thought content normal.        Judgment: Judgment normal.   Assessment/Plan: 1) Painless rectal bleeding with mild anemia-patient is a limited DNR she does not desire to have any invasive procedures at her age and therefore she will be watched closely. If her bleeding becomes more brisk, CTA may be helpful-I suspect she may be having a diverticular bleed but I cannot find any evidence of diverticulosis as there is no scans or colonoscopy reports available to review. 2) HTN. 3) Hyperlipidemia. 4) Hyperglycemia-rule out diabetes;fasting A1c. 52 Limited DNR.  Charna Elizabeth 11/22/2022, 7:20 AM

## 2022-11-23 DIAGNOSIS — I10 Essential (primary) hypertension: Secondary | ICD-10-CM | POA: Diagnosis not present

## 2022-11-23 DIAGNOSIS — K922 Gastrointestinal hemorrhage, unspecified: Secondary | ICD-10-CM | POA: Diagnosis not present

## 2022-11-23 DIAGNOSIS — K625 Hemorrhage of anus and rectum: Secondary | ICD-10-CM | POA: Diagnosis not present

## 2022-11-23 DIAGNOSIS — D509 Iron deficiency anemia, unspecified: Secondary | ICD-10-CM | POA: Diagnosis not present

## 2022-11-23 LAB — CBC
HCT: 36.9 % (ref 36.0–46.0)
Hemoglobin: 12.3 g/dL (ref 12.0–15.0)
MCH: 31.5 pg (ref 26.0–34.0)
MCHC: 33.3 g/dL (ref 30.0–36.0)
MCV: 94.4 fL (ref 80.0–100.0)
Platelets: 246 10*3/uL (ref 150–400)
RBC: 3.91 MIL/uL (ref 3.87–5.11)
RDW: 12.7 % (ref 11.5–15.5)
WBC: 6.6 10*3/uL (ref 4.0–10.5)
nRBC: 0 % (ref 0.0–0.2)

## 2022-11-23 LAB — MAGNESIUM: Magnesium: 2.1 mg/dL (ref 1.7–2.4)

## 2022-11-23 LAB — BASIC METABOLIC PANEL
Anion gap: 7 (ref 5–15)
BUN: 7 mg/dL — ABNORMAL LOW (ref 8–23)
CO2: 28 mmol/L (ref 22–32)
Calcium: 8.9 mg/dL (ref 8.9–10.3)
Chloride: 99 mmol/L (ref 98–111)
Creatinine, Ser: 0.54 mg/dL (ref 0.44–1.00)
GFR, Estimated: >60 mL/min (ref >60)
Glucose, Bld: 101 mg/dL — ABNORMAL HIGH (ref 70–99)
Potassium: 3.7 mmol/L (ref 3.5–5.1)
Sodium: 134 mmol/L — ABNORMAL LOW (ref 135–145)

## 2022-11-23 MED ORDER — INFLUENZA VAC A&B SURF ANT ADJ 0.5 ML IM SUSY: 0.5000 mL | PREFILLED_SYRINGE | INTRAMUSCULAR | Status: DC | PRN

## 2022-11-23 NOTE — Progress Notes (Signed)
Subjective: Minimal bleeding.  Objective: Vital signs in last 24 hours: Temp:  [97.8 F (36.6 C)-98.6 F (37 C)] 97.8 F (36.6 C) (09/05 0612) Pulse Rate:  [73-82] 73 (09/05 0612) Resp:  [16] 16 (09/05 0612) BP: (130-156)/(73-86) 132/73 (09/05 0612) SpO2:  [94 %-98 %] 98 % (09/05 0612) Weight:  [48.4 kg] 48.4 kg (09/05 0500) Last BM Date : 11/22/22  Intake/Output from previous day: No intake/output data recorded. Intake/Output this shift: No intake/output data recorded.  General appearance: alert and no distress GI: soft, non-tender; bowel sounds normal; no masses,  no organomegaly  Lab Results: Recent Labs    11/21/22 1229 11/21/22 1736 11/22/22 0617 11/22/22 1150 11/23/22 0338  WBC 9.3  --   --   --  6.6  HGB 13.5   < > 11.4* 11.8* 12.3  HCT 39.5   < > 34.4* 35.1* 36.9  PLT 251  --   --   --  246   < > = values in this interval not displayed.   BMET Recent Labs    11/21/22 1229 11/22/22 0056 11/23/22 0338  NA 131* 136 134*  K 3.9 3.5 3.7  CL 95* 100 99  CO2 27 27 28   GLUCOSE 157* 97 101*  BUN 14 9 7*  CREATININE 0.60 0.72 0.54  CALCIUM 9.3 9.2 8.9   LFT Recent Labs    11/22/22 0056  PROT 6.9  ALBUMIN 3.6  AST 18  ALT 14  ALKPHOS 51  BILITOT 0.7   PT/INR No results for input(s): "LABPROT", "INR" in the last 72 hours. Hepatitis Panel No results for input(s): "HEPBSAG", "HCVAB", "HEPAIGM", "HEPBIGM" in the last 72 hours. C-Diff No results for input(s): "CDIFFTOX" in the last 72 hours. Fecal Lactopherrin No results for input(s): "FECLLACTOFRN" in the last 72 hours.  Studies/Results: No results found.  Medications: Scheduled:  influenza vaccine adjuvanted  0.5 mL Intramuscular Tomorrow-1000   simvastatin  20 mg Oral QHS   Continuous:  Assessment/Plan: 1) Presumed diverticular bleed. 2) Anemia.   Overall she is stable.  Her HGB actually improved, mildly.  The bleeding she reported is most likely residual blood that is  passing.  Plan: 1) Monitor HGB. 2) If no further bleeding by tomorrow AM and her HGB is stable, she can be discharged home. 3) She can follow up with her PCP.  LOS: 0 days   Norvil Martensen D 11/23/2022, 12:59 PM

## 2022-11-23 NOTE — Progress Notes (Signed)
[facility] Progress Note Patient: Brittney Baker YNW:[id number] DOB: 1934/03/19 DOA: 11/21/2022  DOS: the patient was seen and examined on 11/23/2022  Brief hospital course: PMH of HTN, HLD, chronic leg pain presented to hospital with complaints of bright light blood per rectum without any pain. Multiple episodes reported at home.  Last BM on 11/15 on 9/4 did not have any blood. Assessment and Plan: Lower GI bleed. Suspected diverticular bleed. No imaging so far. If she actually has further episode of bleeding would recommend a CT angiogram. Discussed with the patient. Appreciate GI follow-up for now.  Currently recommended to observe overnight for stability of H&H. Hemoglobin remaining stable for now.  HTN. Blood pressure soft. Will monitor.  Hyponatremia. Clinically not significant.  Monitor.  HLD. On statin.  Continue.  Leg pain. Patient reports she takes ibuprofen for intermittent leg pain. Advised him no significant etiology identified. Monitor. Recommend to take Tylenol instead of ibuprofen.   Subjective: No nausea no vomiting no fever no chills.  HIDA BM with some small amount of blood.  Physical Exam: General: in Mild distress, No Rash Cardiovascular: S1 and S2 Present, No Murmur Respiratory: Good respiratory effort, Bilateral Air entry present. No Crackles, No wheezes Abdomen: Bowel Sound present, No tenderness Extremities: No edema Neuro: Alert and oriented x3, no new focal deficit  Data Reviewed: I have Reviewed nursing notes, Vitals, and Lab results. Since last encounter, pertinent lab results CBC and BMP   . I have ordered test including CBC  . I have discussed pt's care plan and test results with gastroenterology  .   Disposition: Status is: Observation  SCDs Start: 11/21/22 2032   Family Communication: Son at bedside Level of care: Med-Surg   Vitals:   11/22/22 2132 11/23/22 0500 11/23/22 0612 11/23/22 1640  BP: (!) 156/86  132/73 (!) 144/82   Pulse: 82  73 91  Resp: 16  16   Temp: 98 F (36.7 C)  97.8 F (36.6 C) 97.7 F (36.5 C)  TempSrc:    Oral  SpO2: 94%  98% 98%  Weight:  48.4 kg    Height:         Author: Lynden Oxford, MD 11/23/2022 5:49 PM  Please look on [email or web address] to find out who is on call.

## 2022-11-23 NOTE — Plan of Care (Signed)

## 2022-11-24 DIAGNOSIS — K922 Gastrointestinal hemorrhage, unspecified: Secondary | ICD-10-CM | POA: Diagnosis not present

## 2022-11-24 LAB — BASIC METABOLIC PANEL
Anion gap: 11 (ref 5–15)
BUN: 11 mg/dL (ref 8–23)
CO2: 26 mmol/L (ref 22–32)
Calcium: 9.5 mg/dL (ref 8.9–10.3)
Chloride: 100 mmol/L (ref 98–111)
Creatinine, Ser: 0.65 mg/dL (ref 0.44–1.00)
GFR, Estimated: >60 mL/min (ref >60)
Glucose, Bld: 104 mg/dL — ABNORMAL HIGH (ref 70–99)
Potassium: 4.1 mmol/L (ref 3.5–5.1)
Sodium: 137 mmol/L (ref 135–145)

## 2022-11-24 LAB — MAGNESIUM: Magnesium: 1.9 mg/dL (ref 1.7–2.4)

## 2022-11-24 LAB — CBC
HCT: 35.5 % — ABNORMAL LOW (ref 36.0–46.0)
Hemoglobin: 11.7 g/dL — ABNORMAL LOW (ref 12.0–15.0)
MCH: 31 pg (ref 26.0–34.0)
MCHC: 33 g/dL (ref 30.0–36.0)
MCV: 94.2 fL (ref 80.0–100.0)
Platelets: 266 10*3/uL (ref 150–400)
RBC: 3.77 MIL/uL — ABNORMAL LOW (ref 3.87–5.11)
RDW: 12.6 % (ref 11.5–15.5)
WBC: 7.6 10*3/uL (ref 4.0–10.5)
nRBC: 0 % (ref 0.0–0.2)

## 2022-11-24 MED ORDER — ASPIRIN EC 81 MG PO TBEC: 81.0000 mg | DELAYED_RELEASE_TABLET | Freq: Every morning | ORAL | Status: AC

## 2022-11-24 NOTE — TOC Transition Note (Signed)
Transition of Care [facility]) - CM/SW Discharge Note   Patient Details  Name: Brittney Baker MRN: [id number] Date of Birth: 1934-02-26  [facility] CM/SW Contact:  Harriett Sine, RN Phone Number:[phone]  11/24/2022, 3:33 PM   Clinical Narrative:    Pt d/c home with self care, transportation provided by friend. No TOC needs at d/c.   Final next level of care: Home/Self Care Barriers to Discharge: No Barriers Identified   Patient Goals and CMS Choice   Choice offered to / list presented to : NA  Discharge Placement                      Patient and family notified of of transfer: 11/24/22  Discharge Plan and Services Additional resources added to the After Visit Summary for                                       Social Determinants of Health (SDOH) Interventions SDOH Screenings   Food Insecurity: No Food Insecurity (11/21/2022)  Housing: Low Risk  (11/21/2022)  Transportation Needs: No Transportation Needs (11/21/2022)  Utilities: Not At Risk (11/21/2022)  Social Connections: Unknown (05/16/2022)   Received from [facility]  Tobacco Use: Low Risk  (11/21/2022)     Readmission Risk Interventions     No data to display

## 2022-11-24 NOTE — Plan of Care (Signed)
  Problem: Education: Goal: Knowledge of General Education information will improve Description: Including pain rating scale, medication(s)/side effects and non-pharmacologic comfort measures Outcome: Progressing   Problem: Health Behavior/Discharge Planning: Goal: Ability to manage health-related needs will improve Outcome: Progressing   Problem: Clinical Measurements: Goal: Ability to maintain clinical measurements within normal limits will improve Outcome: Progressing Goal: Will remain free from infection Outcome: Progressing Goal: Diagnostic test results will improve Outcome: Progressing Goal: Respiratory complications will improve Outcome: Progressing Goal: Cardiovascular complication will be avoided Outcome: Progressing   Problem: Activity: Goal: Risk for activity intolerance will decrease Outcome: Progressing   Problem: Elimination: Goal: Will not experience complications related to bowel motility Outcome: Progressing Goal: Will not experience complications related to urinary retention Outcome: Progressing   Problem: Pain Managment: Goal: General experience of comfort will improve Outcome: Progressing   Problem: Safety: Goal: Ability to remain free from injury will improve Outcome: Progressing   Problem: Skin Integrity: Goal: Risk for impaired skin integrity will decrease Outcome: Progressing  Pt A/Ox4 on RA. Up to bathroom w walker. X1 loose bm with dark red blood noted.

## 2022-11-26 NOTE — Discharge Summary (Signed)
Physician Discharge Summary   Patient: Brittney Baker MRN: [id number] DOB: 1933-04-21  Admit date:     11/21/2022  Discharge date: 11/24/2022  Discharge Physician: Lynden Oxford  PCP: Aliene Beams, MD  Recommendations at discharge:  Follow up with PCP in 1 week   Follow-up Information     Aliene Beams, MD. Schedule an appointment as soon as possible for a visit in 1 week(s).   Specialty: Family Medicine Why: with CBC lab to look at blood counts Contact information: [address] [place] [address] [phone]                Discharge Diagnoses: Principal Problem:   Acute lower GI bleeding Active Problems:   Hyponatremia   Primary hypertension   Vitamin D deficiency   DNR (do not resuscitate)/DNI(Do Not Intubate)   Pure hypercholesterolemia  Brief hospital course: PMH of HTN, HLD, chronic leg pain presented to hospital with complaints of bright light blood per rectum without any pain. Multiple episodes reported at home.  Last BM on did not have any blood.  Assessment and Plan: Lower GI bleed. Suspected diverticular bleed. No imaging needs so far. Appreciate GI follow-up for now.  Hemoglobin remaining stable for now.  HTN. Blood pressure soft. Will monitor.  Hyponatremia. Clinically not significant.  Monitor.  HLD. On statin.  Continue.  Leg pain. Patient reports she takes ibuprofen for intermittent leg pain. Advised him no significant etiology identified. Monitor. Recommend to take Tylenol instead of ibuprofen.  Consultants:  Gastroenterology   Procedures performed:  none  DISCHARGE MEDICATION: Allergies as of 11/24/2022       Reactions   Clindamycin/lincomycin Other (See Comments)   Unknown reaction - listed on [facility] 05/15/2020        Medication List     STOP taking these medications    ibuprofen 200 MG tablet Commonly known as: ADVIL       TAKE these medications    aspirin EC 81 MG tablet Take 1 tablet (81 mg  total) by mouth every morning. Swallow whole. Start taking on: November 27, 2022   latanoprost 0.005 % ophthalmic solution Commonly known as: XALATAN Place 1 drop into both eyes at bedtime.   simvastatin 20 MG tablet Commonly known as: ZOCOR Take 20 mg by mouth at bedtime.   Vitamin D (Ergocalciferol) 1.25 MG (50000 UNIT) Caps capsule Commonly known as: DRISDOL Take 50,000 Units by mouth every Thursday.       Disposition: Home Diet recommendation: Cardiac diet  Discharge Exam: Vitals:   11/23/22 1640 11/23/22 2120 11/24/22 0500 11/24/22 0620  BP: (!) 144/82 (!) 153/92  120/71  Pulse: 91 86  81  Resp:    16  Temp: 97.7 F (36.5 C) 97.7 F (36.5 C)  98.3 F (36.8 C)  TempSrc: Oral Oral    SpO2: 98% 98%  98%  Weight:   47.2 kg   Height:       General: Appear in no distress; no visible Abnormal Neck Mass Or lumps, Conjunctiva normal Cardiovascular: S1 and S2 Present, no Murmur, Respiratory: good respiratory effort, Bilateral Air entry present and CTA, no Crackles, no wheezes Abdomen: Bowel Sound present, Non tender  Extremities: no Pedal edema Neurology: alert and oriented to time, place, and person  Filed Weights   11/22/22 0358 11/23/22 0500 11/24/22 0500  Weight: 48.5 kg 48.4 kg 47.2 kg   Condition at discharge: stable  The results of significant diagnostics from this hospitalization (including imaging, microbiology, ancillary  and laboratory) are listed below for reference.   Imaging Studies: CT HEAD WO CONTRAST ( )  Result Date: 10/30/2022 CLINICAL DATA:  Provided history: Slurred speech.  Concern for TIA. EXAM: CT HEAD WITHOUT CONTRAST TECHNIQUE: Contiguous axial images were obtained from the base of the skull through the vertex without intravenous contrast. RADIATION DOSE REDUCTION: This exam was performed according to the departmental dose-optimization program which includes automated exposure control, adjustment of the mA and/or kV according to patient size  and/or use of iterative reconstruction technique. COMPARISON:  Head CT 05/15/2020. FINDINGS: Brain: No age advanced or lobar predominant parenchymal atrophy. Patchy and ill-defined hypoattenuation within the cerebral white matter, nonspecific but compatible with mild-to-moderate chronic small vessel ischemic disease. There is no acute intracranial hemorrhage. No demarcated cortical infarct. No extra-axial fluid collection. No evidence of an intracranial mass. No midline shift. Vascular: No hyperdense vessel.  Atherosclerotic calcifications. Skull: No calvarial fracture or aggressive osseous lesion. Sinuses/Orbits: No mass or acute finding within the imaged orbits. No significant paranasal sinus disease at the imaged levels. IMPRESSION: 1.  No evidence of an acute intracranial abnormality. 2. Mild-to-moderate chronic small vessel ischemic changes within the cerebral white matter. Electronically Signed   By: Jackey Loge D.O.   On: 10/30/2022 11:19    Microbiology: No results found for this or any previous visit. Labs: CBC: Recent Labs  Lab 11/21/22 1229 11/21/22 1736 11/22/22 0056 11/22/22 0617 11/22/22 1150 11/23/22 0338 11/24/22 0325  WBC 9.3  --   --   --   --  6.6 7.6  HGB 13.5   < > 11.7* 11.4* 11.8* 12.3 11.7*  HCT 39.5   < > 34.6* 34.4* 35.1* 36.9 35.5*  MCV 91.2  --   --   --   --  94.4 94.2  PLT 251  --   --   --   --  246 266   < > = values in this interval not displayed.   Basic Metabolic Panel: Recent Labs  Lab 11/21/22 1229 11/22/22 0056 11/23/22 0338 11/24/22 0325  NA 131* 136 134* 137  K 3.9 3.5 3.7 4.1  CL 95* 100 99 100  CO2 27 27 28 26   GLUCOSE 157* 97 101* 104*  BUN 14 9 7* 11  CREATININE 0.60 0.72 0.54 0.65  CALCIUM 9.3 9.2 8.9 9.5  MG  --  2.1 2.1 1.9   Liver Function Tests: Recent Labs  Lab 11/21/22 1229 11/22/22 0056  AST 17 18  ALT 11 14  ALKPHOS 53 51  BILITOT 0.5 0.7  PROT 7.5 6.9  ALBUMIN 4.2 3.6   CBG: No results for input(s): "GLUCAP" in  the last 168 hours.  Discharge time spent: greater than 30 minutes.  Author: Lynden Oxford, MD  [facility] 11/24/2022

## 2022-11-30 DIAGNOSIS — Z9289 Personal history of other medical treatment: Secondary | ICD-10-CM | POA: Diagnosis not present

## 2022-11-30 DIAGNOSIS — Z9181 History of falling: Secondary | ICD-10-CM | POA: Diagnosis not present

## 2022-11-30 DIAGNOSIS — K5731 Diverticulosis of large intestine without perforation or abscess with bleeding: Secondary | ICD-10-CM | POA: Diagnosis not present

## 2022-11-30 DIAGNOSIS — I10 Essential (primary) hypertension: Secondary | ICD-10-CM | POA: Diagnosis not present

## 2022-11-30 DIAGNOSIS — Z23 Encounter for immunization: Secondary | ICD-10-CM | POA: Diagnosis not present

## 2022-11-30 DIAGNOSIS — I700 Atherosclerosis of aorta: Secondary | ICD-10-CM | POA: Diagnosis not present

## 2022-11-30 DIAGNOSIS — Z9989 Dependence on other enabling machines and devices: Secondary | ICD-10-CM | POA: Diagnosis not present

## 2022-11-30 DIAGNOSIS — I2510 Atherosclerotic heart disease of native coronary artery without angina pectoris: Secondary | ICD-10-CM | POA: Diagnosis not present

## 2022-11-30 DIAGNOSIS — S22080D Wedge compression fracture of T11-T12 vertebra, subsequent encounter for fracture with routine healing: Secondary | ICD-10-CM | POA: Diagnosis not present

## 2022-12-07 ENCOUNTER — Other Ambulatory Visit: Payer: Medicare HMO

## 2022-12-27 DIAGNOSIS — H6121 Impacted cerumen, right ear: Secondary | ICD-10-CM | POA: Diagnosis not present

## 2022-12-27 DIAGNOSIS — M810 Age-related osteoporosis without current pathological fracture: Secondary | ICD-10-CM | POA: Diagnosis not present

## 2022-12-27 DIAGNOSIS — Z8719 Personal history of other diseases of the digestive system: Secondary | ICD-10-CM | POA: Diagnosis not present

## 2022-12-27 DIAGNOSIS — E559 Vitamin D deficiency, unspecified: Secondary | ICD-10-CM | POA: Diagnosis not present

## 2022-12-27 DIAGNOSIS — E7800 Pure hypercholesterolemia, unspecified: Secondary | ICD-10-CM | POA: Diagnosis not present

## 2022-12-27 DIAGNOSIS — I10 Essential (primary) hypertension: Secondary | ICD-10-CM | POA: Diagnosis not present

## 2022-12-27 DIAGNOSIS — I2510 Atherosclerotic heart disease of native coronary artery without angina pectoris: Secondary | ICD-10-CM | POA: Diagnosis not present

## 2022-12-27 DIAGNOSIS — H6122 Impacted cerumen, left ear: Secondary | ICD-10-CM | POA: Diagnosis not present

## 2023-10-26 DIAGNOSIS — E7800 Pure hypercholesterolemia, unspecified: Secondary | ICD-10-CM | POA: Diagnosis not present

## 2023-10-26 DIAGNOSIS — Z23 Encounter for immunization: Secondary | ICD-10-CM | POA: Diagnosis not present

## 2023-10-26 DIAGNOSIS — Z1331 Encounter for screening for depression: Secondary | ICD-10-CM | POA: Diagnosis not present

## 2023-10-26 DIAGNOSIS — M810 Age-related osteoporosis without current pathological fracture: Secondary | ICD-10-CM | POA: Diagnosis not present

## 2023-10-26 DIAGNOSIS — I10 Essential (primary) hypertension: Secondary | ICD-10-CM | POA: Diagnosis not present

## 2023-10-26 DIAGNOSIS — Z0000 Encounter for general adult medical examination without abnormal findings: Secondary | ICD-10-CM | POA: Diagnosis not present

## 2023-10-26 DIAGNOSIS — I2510 Atherosclerotic heart disease of native coronary artery without angina pectoris: Secondary | ICD-10-CM | POA: Diagnosis not present

## 2023-12-25 DIAGNOSIS — H2513 Age-related nuclear cataract, bilateral: Secondary | ICD-10-CM | POA: Diagnosis not present

## 2023-12-25 DIAGNOSIS — H401132 Primary open-angle glaucoma, bilateral, moderate stage: Secondary | ICD-10-CM | POA: Diagnosis not present

## 2023-12-25 DIAGNOSIS — H5203 Hypermetropia, bilateral: Secondary | ICD-10-CM | POA: Diagnosis not present
